# Patient Record
Sex: Male | Born: 1985 | Race: White | Hispanic: Yes | Marital: Single | Smoking: Current some day smoker
Health system: Southern US, Community
[De-identification: ages and names within clinical notes are randomized; demographics above are authoritative.]

## PROBLEM LIST (undated history)

## (undated) DIAGNOSIS — N200 Calculus of kidney: Secondary | ICD-10-CM

## (undated) HISTORY — PX: TONSILLECTOMY: SUR1361

---
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
## 2004-01-07 ENCOUNTER — Emergency Department (HOSPITAL_COMMUNITY): Admission: EM | Admit: 2004-01-07 | Discharge: 2004-01-07 | Payer: Self-pay | Admitting: Emergency Medicine

## 2004-01-22 ENCOUNTER — Emergency Department (HOSPITAL_COMMUNITY): Admission: EM | Admit: 2004-01-22 | Discharge: 2004-01-22 | Payer: Self-pay | Admitting: Emergency Medicine

## 2004-01-27 ENCOUNTER — Emergency Department (HOSPITAL_COMMUNITY): Admission: EM | Admit: 2004-01-27 | Discharge: 2004-01-27 | Payer: Self-pay | Admitting: *Deleted

## 2004-03-16 ENCOUNTER — Emergency Department (HOSPITAL_COMMUNITY): Admission: EM | Admit: 2004-03-16 | Discharge: 2004-03-16 | Payer: Self-pay | Admitting: Emergency Medicine

## 2004-03-21 ENCOUNTER — Emergency Department (HOSPITAL_COMMUNITY): Admission: EM | Admit: 2004-03-21 | Discharge: 2004-03-21 | Payer: Self-pay | Admitting: Emergency Medicine

## 2004-05-05 ENCOUNTER — Emergency Department (HOSPITAL_COMMUNITY): Admission: EM | Admit: 2004-05-05 | Discharge: 2004-05-06 | Payer: Self-pay | Admitting: Emergency Medicine

## 2004-05-06 ENCOUNTER — Ambulatory Visit (HOSPITAL_COMMUNITY): Admission: RE | Admit: 2004-05-06 | Discharge: 2004-05-06 | Payer: Self-pay | Admitting: Emergency Medicine

## 2004-07-31 ENCOUNTER — Emergency Department (HOSPITAL_COMMUNITY): Admission: EM | Admit: 2004-07-31 | Discharge: 2004-08-01 | Payer: Self-pay

## 2005-05-02 ENCOUNTER — Emergency Department (HOSPITAL_COMMUNITY): Admission: EM | Admit: 2005-05-02 | Discharge: 2005-05-02 | Payer: Self-pay | Admitting: *Deleted

## 2005-05-06 ENCOUNTER — Ambulatory Visit: Payer: Self-pay | Admitting: Orthopedic Surgery

## 2005-05-11 ENCOUNTER — Ambulatory Visit: Payer: Self-pay | Admitting: Orthopedic Surgery

## 2005-10-23 ENCOUNTER — Emergency Department (HOSPITAL_COMMUNITY): Admission: EM | Admit: 2005-10-23 | Discharge: 2005-10-23 | Payer: Self-pay | Admitting: Emergency Medicine

## 2012-09-01 ENCOUNTER — Encounter (HOSPITAL_COMMUNITY): Payer: Self-pay

## 2012-09-01 ENCOUNTER — Emergency Department (HOSPITAL_COMMUNITY)
Admission: EM | Admit: 2012-09-01 | Discharge: 2012-09-01 | Disposition: A | Discharge status: Home / Self Care | Payer: Self-pay | Attending: Emergency Medicine | Admitting: Emergency Medicine

## 2012-09-01 ENCOUNTER — Emergency Department (HOSPITAL_COMMUNITY): Payer: Self-pay

## 2012-09-01 DIAGNOSIS — N201 Calculus of ureter: Secondary | ICD-10-CM | POA: Insufficient documentation

## 2012-09-01 DIAGNOSIS — N23 Unspecified renal colic: Secondary | ICD-10-CM

## 2012-09-01 HISTORY — DX: Calculus of kidney: N20.0

## 2012-09-01 MED ORDER — ONDANSETRON HCL 4 MG/2ML IJ SOLN
4.0000 mg | Freq: Once | INTRAMUSCULAR | Status: AC
  Administered 2012-09-01: 4 mg via INTRAVENOUS

## 2012-09-01 MED ORDER — PROMETHAZINE HCL 25 MG PO TABS: 25.0000 mg | ORAL_TABLET | Freq: Four times a day (QID) | ORAL | Status: DC | PRN

## 2012-09-01 MED ORDER — HYDROMORPHONE HCL PF 1 MG/ML IJ SOLN
INTRAMUSCULAR | Status: AC
  Administered 2012-09-01: 1 mg via INTRAVENOUS
  Filled 2012-09-01: qty 1

## 2012-09-01 MED ORDER — HYDROMORPHONE HCL PF 1 MG/ML IJ SOLN
1.0000 mg | Freq: Once | INTRAMUSCULAR | Status: AC
  Administered 2012-09-01: 1 mg via INTRAVENOUS

## 2012-09-01 MED ORDER — HYDROMORPHONE HCL PF 1 MG/ML IJ SOLN
INTRAMUSCULAR | Status: AC
  Administered 2012-09-01: 1 mg
  Filled 2012-09-01: qty 1

## 2012-09-01 MED ORDER — SODIUM CHLORIDE 0.9 % IV SOLN
INTRAVENOUS | Status: DC
  Administered 2012-09-01: 21:00:00 via INTRAVENOUS

## 2012-09-01 MED ORDER — FENTANYL CITRATE 0.05 MG/ML IJ SOLN
100.0000 ug | INTRAMUSCULAR | Status: DC | PRN
  Administered 2012-09-01: 100 ug via INTRAVENOUS
  Filled 2012-09-01: qty 2

## 2012-09-01 MED ORDER — KETOROLAC TROMETHAMINE 30 MG/ML IJ SOLN
30.0000 mg | Freq: Once | INTRAMUSCULAR | Status: AC
  Administered 2012-09-01: 30 mg via INTRAVENOUS
  Filled 2012-09-01: qty 1

## 2012-09-01 MED ORDER — OXYCODONE-ACETAMINOPHEN 5-325 MG PO TABS: 1.0000 | ORAL_TABLET | Freq: Four times a day (QID) | ORAL | Status: DC | PRN

## 2012-09-01 MED ORDER — OXYCODONE-ACETAMINOPHEN 5-325 MG PO TABS: 2.0000 | ORAL_TABLET | ORAL | Status: DC | PRN

## 2012-09-01 MED ORDER — HYDROMORPHONE HCL PF 1 MG/ML IJ SOLN
INTRAMUSCULAR | Status: AC
  Filled 2012-09-01: qty 1

## 2012-09-01 MED ORDER — ONDANSETRON HCL 4 MG/2ML IJ SOLN
4.0000 mg | INTRAMUSCULAR | Status: DC | PRN
  Administered 2012-09-01: 4 mg via INTRAVENOUS
  Filled 2012-09-01: qty 2

## 2012-09-01 MED ORDER — ONDANSETRON 8 MG PO TBDP
ORAL_TABLET | ORAL | Status: AC
  Administered 2012-09-01: 8 mg
  Filled 2012-09-01: qty 1

## 2012-09-01 MED ORDER — ONDANSETRON HCL 4 MG/2ML IJ SOLN
INTRAMUSCULAR | Status: AC
  Administered 2012-09-01: 4 mg via INTRAVENOUS
  Filled 2012-09-01: qty 2

## 2012-09-01 NOTE — ED Provider Notes (Signed)
History     CSN: [id number]  Arrival date & time 09/01/12  2047   First MD Initiated Contact with Patient 09/01/12 2102      Chief Complaint  Patient presents with  . Flank Pain     HPI Pt was seen at 2115.  Per pt, c/o sudden onset and persistence of constant left sided flank "pain" that began approx 1 hour ago.  Pt describes the pain as "my kidney stone pain."  Has been associated with nausea.  Denies injury, no dysuria/hematuria, no diarrhea, no fevers, no testicular pain/swelling, no abd pain, no CP/SOB, no rash.     Past Medical History  Diagnosis Date  . Kidney stones     No past surgical history on file.   History  Substance Use Topics  . Smoking status: Never Smoker   . Smokeless tobacco: Not on file  . Alcohol Use: Yes      Review of Systems ROS: Statement: All systems negative except as marked or noted in the HPI; Constitutional: Negative for fever and chills. ; ; Eyes: Negative for eye pain, redness and discharge. ; ; ENMT: Negative for ear pain, hoarseness, nasal congestion, sinus pressure and sore throat. ; ; Cardiovascular: Negative for chest pain, palpitations, diaphoresis, dyspnea and peripheral edema. ; ; Respiratory: Negative for cough, wheezing and stridor. ; ; Gastrointestinal: +nausea. Negative for diarrhea, abdominal pain, blood in stool, hematemesis, jaundice and rectal bleeding. . ; ; Genitourinary: +left flank pain. Negative for dysuria and hematuria. ; ; Musculoskeletal: Negative for back pain and neck pain. Negative for swelling and trauma.; ; Skin: Negative for pruritus, rash, abrasions, blisters, bruising and skin lesion.; ; Neuro: Negative for headache, lightheadedness and neck stiffness. Negative for weakness, altered level of consciousness , altered mental status, extremity weakness, paresthesias, involuntary movement, seizure and syncope.       Allergies  Review of patient's allergies indicates no known allergies.  Home Medications  No  current outpatient prescriptions on file.  BP 124/107  Pulse 66  Temp 98.4 F (36.9 C) (Oral)  Resp 20  SpO2 100%  Physical Exam 2120: Physical examination:  Nursing notes reviewed; Vital signs and O2 SAT reviewed;  Constitutional: Well developed, Well nourished, Well hydrated, Uncomfortable appearing; Head:  Normocephalic, atraumatic; Eyes: EOMI, PERRL, No scleral icterus; ENMT: Mouth and pharynx normal, Mucous membranes moist; Neck: Supple, Full range of motion, No lymphadenopathy; Cardiovascular: Regular rate and rhythm, No gallop; Respiratory: Breath sounds clear & equal bilaterally, No wheezes.  Speaking full sentences with ease, Normal respiratory effort/excursion; Chest: Nontender, Movement normal; Abdomen: Soft, Nontender, Nondistended, Normal bowel sounds; Genitourinary: No CVA tenderness; Spine:  No midline CS, TS, LS tenderness. +TTP left lumbar paraspinal muscles.;; Extremities: Pulses normal, No tenderness, No edema, No calf edema or asymmetry.; Neuro: AA&Ox3, Major CN grossly intact.  Speech clear. No gross focal motor or sensory deficits in extremities.; Skin: Color normal, Warm, Dry.   ED Course  Procedures    MDM  MDM Reviewed: nursing note, vitals and previous chart Interpretation: CT scan     Ct Abdomen Pelvis Wo Contrast 09/01/2012  *RADIOLOGY REPORT*  Clinical Data: Left-sided flank pain for 2 hours, history of renal stones, prior lithotripsy  CT ABDOMEN AND PELVIS WITHOUT CONTRAST  Technique:  Multidetector CT imaging of the abdomen and pelvis was performed following the standard protocol without intravenous contrast.  Comparison: Abdominal CT - 10/23/2005  Findings: The lack of intravenous contrast limits the ability to evaluate solid abdominal organs.  There is  an approximately 5 x 5 mm stone within the superior aspect of the left ureter (image 46, series 2, coronal image 41) which results in moderate upstream ureterectasis and pelvocaliectasis. No right sided renal  stones are identified.  No right-sided urinary obstruction.  No stones seen within the urinary bladder.  Normal noncontrast appearance of the bilateral adrenal glands, pancreas and spleen.  Incidental note is made of a small splenule.  Normal hepatic contour.  Normal noncontrast appearance of the gallbladder.  No ascites.  Scattered minimal colonic diverticulosis without evidence of diverticulitis.  The bowel is otherwise normal in course and caliber without wall thickening or evidence of obstruction.  Normal noncontrast appearance of the appendix.  No pneumoperitoneum, pneumatosis or portal venous gas.  Normal caliber of the abdominal aorta.  No definite retroperitoneal, mesenteric, pelvic or inguinal lymphadenopathy on this noncontrast examination.  Normal noncontrast appearance of the pelvic organs.  No free fluid in the pelvis.  Limited visualization of the lower thorax is negative for focal airspace opacity or pleural effusion.  Normal heart size.  No pericardial effusion.  No acute or aggressive osseous abnormalities.  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  IMPRESSION: Approximately 5 x 5 mm stone in the superior aspect of the left ureter results in moderate to upstream ureterectasis and pyelocaliectasis.  No additional renal stones are identified.  No right-sided urinary obstruction.   Original Report Authenticated By: Waynard Reeds, M.D.      2230:  Pt continues uncomfortable.  More IV meds ordered.  Sign out to Dr. Adriana Simas.     Laray Anger, DO 09/03/12 [phone]

## 2012-09-01 NOTE — ED Provider Notes (Signed)
History     CSN: [id number]  Arrival date & time 09/01/12  2047   First MD Initiated Contact with Patient 09/01/12 2102      Chief Complaint  Patient presents with  . Flank Pain    (Consider location/radiation/quality/duration/timing/severity/associated sxs/prior treatment) HPI  Past Medical History  Diagnosis Date  . Kidney stones     No past surgical history on file.  No family history on file.  History  Substance Use Topics  . Smoking status: Never Smoker   . Smokeless tobacco: Not on file  . Alcohol Use: Yes      Review of Systems  Allergies  Review of patient's allergies indicates no known allergies.  Home Medications   Current Outpatient Rx  Name Route Sig Dispense Refill  . OXYCODONE-ACETAMINOPHEN 5-325 MG PO TABS Oral Take 1-2 tablets by mouth every 6 (six) hours as needed for pain. 25 tablet 0  . PROMETHAZINE HCL 25 MG PO TABS Oral Take 1 tablet (25 mg total) by mouth every 6 (six) hours as needed for nausea. 15 tablet 0    BP 124/107  Pulse 66  Temp 98.4 F (36.9 C) (Oral)  Resp 20  SpO2 100%  Physical Exam  ED Course  Procedures (including critical care time)   Labs Reviewed  URINALYSIS, ROUTINE W REFLEX MICROSCOPIC  URINE CULTURE   Ct Abdomen Pelvis Wo Contrast  09/01/2012  *RADIOLOGY REPORT*  Clinical Data: Left-sided flank pain for 2 hours, history of renal stones, prior lithotripsy  CT ABDOMEN AND PELVIS WITHOUT CONTRAST  Technique:  Multidetector CT imaging of the abdomen and pelvis was performed following the standard protocol without intravenous contrast.  Comparison: Abdominal CT - 10/23/2005  Findings: The lack of intravenous contrast limits the ability to evaluate solid abdominal organs.  There is an approximately 5 x 5 mm stone within the superior aspect of the left ureter (image 46, series 2, coronal image 41) which results in moderate upstream ureterectasis and pelvocaliectasis. No right sided renal stones are identified.  No  right-sided urinary obstruction.  No stones seen within the urinary bladder.  Normal noncontrast appearance of the bilateral adrenal glands, pancreas and spleen.  Incidental note is made of a small splenule.  Normal hepatic contour.  Normal noncontrast appearance of the gallbladder.  No ascites.  Scattered minimal colonic diverticulosis without evidence of diverticulitis.  The bowel is otherwise normal in course and caliber without wall thickening or evidence of obstruction.  Normal noncontrast appearance of the appendix.  No pneumoperitoneum, pneumatosis or portal venous gas.  Normal caliber of the abdominal aorta.  No definite retroperitoneal, mesenteric, pelvic or inguinal lymphadenopathy on this noncontrast examination.  Normal noncontrast appearance of the pelvic organs.  No free fluid in the pelvis.  Limited visualization of the lower thorax is negative for focal airspace opacity or pleural effusion.  Normal heart size.  No pericardial effusion.  No acute or aggressive osseous abnormalities.  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  IMPRESSION: Approximately 5 x 5 mm stone in the superior aspect of the left ureter results in moderate to upstream ureterectasis and pyelocaliectasis.  No additional renal stones are identified.  No right-sided urinary obstruction.   Original Report Authenticated By: Waynard Reeds, M.D.      1. Left ureteral calculus   2. Ureteral colic       MDM  CT scan shows 5 x 5 mm stone in left ureter. Discharge home with  medications for pain and nausea. Followup urology.        Donnetta Hutching, MD 09/01/12 2320

## 2012-09-01 NOTE — ED Notes (Signed)
Severe left flank pain onset 1 hour ago.

## 2012-09-05 MED FILL — Oxycodone w/ Acetaminophen Tab 5-325 MG: ORAL | Qty: 6 | Status: AC

## 2014-10-27 ENCOUNTER — Inpatient Hospital Stay (HOSPITAL_COMMUNITY): Payer: Medicaid Other | Admitting: Anesthesiology

## 2014-10-27 ENCOUNTER — Encounter (HOSPITAL_COMMUNITY)
Admission: EM | Disposition: A | Discharge status: Home / Self Care | Payer: Self-pay | Source: Home / Self Care | Attending: Internal Medicine

## 2014-10-27 ENCOUNTER — Encounter (HOSPITAL_COMMUNITY): Payer: Self-pay

## 2014-10-27 ENCOUNTER — Inpatient Hospital Stay (HOSPITAL_COMMUNITY)
Admission: EM | Admit: 2014-10-27 | Discharge: 2014-10-30 | DRG: 369 | Disposition: A | Discharge status: Home / Self Care | Payer: Medicaid Other | Attending: Internal Medicine | Admitting: Internal Medicine

## 2014-10-27 DIAGNOSIS — F17210 Nicotine dependence, cigarettes, uncomplicated: Secondary | ICD-10-CM | POA: Diagnosis present

## 2014-10-27 DIAGNOSIS — K2931 Chronic superficial gastritis with bleeding: Secondary | ICD-10-CM | POA: Diagnosis not present

## 2014-10-27 DIAGNOSIS — D62 Acute posthemorrhagic anemia: Secondary | ICD-10-CM | POA: Diagnosis present

## 2014-10-27 DIAGNOSIS — K2971 Gastritis, unspecified, with bleeding: Secondary | ICD-10-CM

## 2014-10-27 DIAGNOSIS — K922 Gastrointestinal hemorrhage, unspecified: Secondary | ICD-10-CM | POA: Diagnosis present

## 2014-10-27 DIAGNOSIS — K2901 Acute gastritis with bleeding: Secondary | ICD-10-CM | POA: Diagnosis not present

## 2014-10-27 DIAGNOSIS — K226 Gastro-esophageal laceration-hemorrhage syndrome: Principal | ICD-10-CM | POA: Diagnosis present

## 2014-10-27 DIAGNOSIS — Z87442 Personal history of urinary calculi: Secondary | ICD-10-CM | POA: Diagnosis not present

## 2014-10-27 HISTORY — PX: ESOPHAGOGASTRODUODENOSCOPY (EGD) WITH PROPOFOL: SHX5813

## 2014-10-27 LAB — COMPREHENSIVE METABOLIC PANEL
ALT: 13 U/L (ref 0–53)
AST: 17 U/L (ref 0–37)
Albumin: 4.2 g/dL (ref 3.5–5.2)
Alkaline Phosphatase: 83 U/L (ref 39–117)
Anion gap: 13 (ref 5–15)
BUN: 23 mg/dL (ref 6–23)
CO2: 25 mEq/L (ref 19–32)
Calcium: 9.1 mg/dL (ref 8.4–10.5)
Chloride: 104 mEq/L (ref 96–112)
Creatinine, Ser: 0.81 mg/dL (ref 0.50–1.35)
GFR calc Af Amer: >90 mL/min (ref >90)
GFR calc non Af Amer: >90 mL/min (ref >90)
Glucose, Bld: 102 mg/dL — ABNORMAL HIGH (ref 70–99)
Potassium: 4.1 mEq/L (ref 3.7–5.3)
Sodium: 142 mEq/L (ref 137–147)
Total Bilirubin: 0.6 mg/dL (ref 0.3–1.2)
Total Protein: 7.3 g/dL (ref 6.0–8.3)

## 2014-10-27 LAB — CBC
HCT: 36.9 % — ABNORMAL LOW (ref 39.0–52.0)
HCT: 30.8 % — ABNORMAL LOW (ref 39.0–52.0)
HCT: 29 % — ABNORMAL LOW (ref 39.0–52.0)
Hemoglobin: 12.8 g/dL — ABNORMAL LOW (ref 13.0–17.0)
Hemoglobin: 10.6 g/dL — ABNORMAL LOW (ref 13.0–17.0)
Hemoglobin: 10.1 g/dL — ABNORMAL LOW (ref 13.0–17.0)
MCH: 31.1 pg (ref 26.0–34.0)
MCH: 30.9 pg (ref 26.0–34.0)
MCH: 30.9 pg (ref 26.0–34.0)
MCHC: 34.7 g/dL (ref 30.0–36.0)
MCHC: 34.4 g/dL (ref 30.0–36.0)
MCHC: 34.8 g/dL (ref 30.0–36.0)
MCV: 89.6 fL (ref 78.0–100.0)
MCV: 89.8 fL (ref 78.0–100.0)
MCV: 88.7 fL (ref 78.0–100.0)
Platelets: 272 10*3/uL (ref 150–400)
Platelets: 249 10*3/uL (ref 150–400)
Platelets: 237 10*3/uL (ref 150–400)
RBC: 4.12 MIL/uL — ABNORMAL LOW (ref 4.22–5.81)
RBC: 3.43 MIL/uL — ABNORMAL LOW (ref 4.22–5.81)
RBC: 3.27 MIL/uL — ABNORMAL LOW (ref 4.22–5.81)
RDW: 12.8 % (ref 11.5–15.5)
RDW: 12.6 % (ref 11.5–15.5)
RDW: 12.7 % (ref 11.5–15.5)
WBC: 11.3 10*3/uL — ABNORMAL HIGH (ref 4.0–10.5)
WBC: 8.4 10*3/uL (ref 4.0–10.5)
WBC: 8.1 10*3/uL (ref 4.0–10.5)

## 2014-10-27 LAB — ABO/RH: ABO/RH(D): O POS

## 2014-10-27 LAB — CBC WITH DIFFERENTIAL/PLATELET
Basophils Absolute: 0 10*3/uL (ref 0.0–0.1)
Basophils Relative: 0 % (ref 0–1)
Eosinophils Absolute: 0.2 10*3/uL (ref 0.0–0.7)
Eosinophils Relative: 2 % (ref 0–5)
HCT: 41.5 % (ref 39.0–52.0)
Hemoglobin: 14.1 g/dL (ref 13.0–17.0)
Lymphocytes Relative: 18 % (ref 12–46)
Lymphs Abs: 1.7 10*3/uL (ref 0.7–4.0)
MCH: 30.9 pg (ref 26.0–34.0)
MCHC: 34 g/dL (ref 30.0–36.0)
MCV: 90.8 fL (ref 78.0–100.0)
Monocytes Absolute: 1.2 10*3/uL — ABNORMAL HIGH (ref 0.1–1.0)
Monocytes Relative: 12 % (ref 3–12)
Neutro Abs: 6.4 10*3/uL (ref 1.7–7.7)
Neutrophils Relative %: 68 % (ref 43–77)
Platelets: 265 10*3/uL (ref 150–400)
RBC: 4.57 MIL/uL (ref 4.22–5.81)
RDW: 12.6 % (ref 11.5–15.5)
WBC: 9.5 10*3/uL (ref 4.0–10.5)

## 2014-10-27 LAB — MRSA PCR SCREENING: MRSA by PCR: NEGATIVE

## 2014-10-27 LAB — PREPARE RBC (CROSSMATCH)

## 2014-10-27 LAB — I-STAT CG4 LACTIC ACID, ED: Lactic Acid, Venous: 0.87 mmol/L (ref 0.5–2.2)

## 2014-10-27 LAB — PROTIME-INR
INR: 1.02 (ref 0.00–1.49)
Prothrombin Time: 13.5 seconds (ref 11.6–15.2)

## 2014-10-27 SURGERY — ESOPHAGOGASTRODUODENOSCOPY (EGD) WITH PROPOFOL
Anesthesia: Monitor Anesthesia Care

## 2014-10-27 MED ORDER — SODIUM CHLORIDE 0.9 % IV SOLN
INTRAVENOUS | Status: DC
  Administered 2014-10-27: 15:00:00 via INTRAVENOUS

## 2014-10-27 MED ORDER — FENTANYL CITRATE 0.05 MG/ML IJ SOLN
INTRAMUSCULAR | Status: DC | PRN
  Administered 2014-10-27 (×2): 25 ug via INTRAVENOUS

## 2014-10-27 MED ORDER — ONDANSETRON HCL 4 MG PO TABS
4.0000 mg | ORAL_TABLET | Freq: Four times a day (QID) | ORAL | Status: AC
  Administered 2014-10-27 – 2014-10-28 (×4): 4 mg via ORAL
  Filled 2014-10-27 (×8): qty 1

## 2014-10-27 MED ORDER — MIDAZOLAM HCL 5 MG/5ML IJ SOLN
INTRAMUSCULAR | Status: DC | PRN
  Administered 2014-10-27 (×2): 2 mg via INTRAVENOUS

## 2014-10-27 MED ORDER — ONDANSETRON HCL 4 MG PO TABS: 4.0000 mg | ORAL_TABLET | Freq: Four times a day (QID) | ORAL | Status: DC | PRN

## 2014-10-27 MED ORDER — MORPHINE SULFATE 2 MG/ML IJ SOLN
1.0000 mg | INTRAMUSCULAR | Status: DC | PRN
  Administered 2014-10-28 – 2014-10-30 (×3): 1 mg via INTRAVENOUS
  Filled 2014-10-27 (×3): qty 1

## 2014-10-27 MED ORDER — MIDAZOLAM HCL 2 MG/2ML IJ SOLN
INTRAMUSCULAR | Status: AC
  Filled 2014-10-27: qty 2

## 2014-10-27 MED ORDER — GLYCOPYRROLATE 0.2 MG/ML IJ SOLN
INTRAMUSCULAR | Status: AC
  Filled 2014-10-27: qty 1

## 2014-10-27 MED ORDER — PROPOFOL 10 MG/ML IV EMUL
INTRAVENOUS | Status: AC
  Filled 2014-10-27: qty 20

## 2014-10-27 MED ORDER — METOCLOPRAMIDE HCL 5 MG/ML IJ SOLN
INTRAMUSCULAR | Status: AC
  Filled 2014-10-27: qty 2

## 2014-10-27 MED ORDER — ONDANSETRON HCL 4 MG/2ML IJ SOLN
INTRAMUSCULAR | Status: DC | PRN
  Administered 2014-10-27: 4 mg via INTRAVENOUS

## 2014-10-27 MED ORDER — SODIUM CHLORIDE 0.9 % IV SOLN
1000.0000 mL | Freq: Once | INTRAVENOUS | Status: AC
  Administered 2014-10-27: 1000 mL via INTRAVENOUS

## 2014-10-27 MED ORDER — METOCLOPRAMIDE HCL 5 MG/ML IJ SOLN
10.0000 mg | Freq: Four times a day (QID) | INTRAMUSCULAR | Status: AC
  Administered 2014-10-27 – 2014-10-28 (×4): 10 mg via INTRAVENOUS
  Filled 2014-10-27 (×4): qty 2

## 2014-10-27 MED ORDER — ONDANSETRON HCL 4 MG/2ML IJ SOLN: 4.0000 mg | Freq: Four times a day (QID) | INTRAMUSCULAR | Status: DC | PRN

## 2014-10-27 MED ORDER — SODIUM CHLORIDE 0.9 % IV SOLN: Freq: Once | INTRAVENOUS | Status: DC

## 2014-10-27 MED ORDER — FENTANYL CITRATE 0.05 MG/ML IJ SOLN
INTRAMUSCULAR | Status: AC
  Filled 2014-10-27: qty 2

## 2014-10-27 MED ORDER — SODIUM CHLORIDE 0.9 % IV SOLN
80.0000 mg | Freq: Two times a day (BID) | INTRAVENOUS | Status: DC
  Administered 2014-10-27 – 2014-10-28 (×4): 80 mg via INTRAVENOUS
  Filled 2014-10-27 (×5): qty 80

## 2014-10-27 MED ORDER — PANTOPRAZOLE SODIUM 40 MG IV SOLR
40.0000 mg | Freq: Once | INTRAVENOUS | Status: AC
  Administered 2014-10-27: 40 mg via INTRAVENOUS

## 2014-10-27 MED ORDER — ONDANSETRON HCL 4 MG/2ML IJ SOLN
4.0000 mg | Freq: Once | INTRAMUSCULAR | Status: AC
  Administered 2014-10-27: 4 mg via INTRAVENOUS

## 2014-10-27 MED ORDER — PROPOFOL 10 MG/ML IV BOLUS
INTRAVENOUS | Status: AC
  Filled 2014-10-27: qty 20

## 2014-10-27 MED ORDER — STERILE WATER FOR IRRIGATION IR SOLN
Status: DC | PRN
  Administered 2014-10-27: 13:00:00

## 2014-10-27 MED ORDER — LORAZEPAM 2 MG/ML IJ SOLN
1.0000 mg | Freq: Four times a day (QID) | INTRAMUSCULAR | Status: AC
  Administered 2014-10-27 – 2014-10-28 (×3): 1 mg via INTRAVENOUS
  Filled 2014-10-27 (×3): qty 1

## 2014-10-27 MED ORDER — SODIUM CHLORIDE 0.9 % IV SOLN
INTRAVENOUS | Status: DC
  Administered 2014-10-27: 10:00:00 via INTRAVENOUS
  Administered 2014-10-27 – 2014-10-28 (×2): 1000 mL via INTRAVENOUS
  Administered 2014-10-28 – 2014-10-30 (×3): via INTRAVENOUS

## 2014-10-27 MED ORDER — SODIUM CHLORIDE 0.9 % IV SOLN: INTRAVENOUS | Status: DC

## 2014-10-27 MED ORDER — INFLUENZA VAC SPLIT QUAD 0.5 ML IM SUSY
0.5000 mL | PREFILLED_SYRINGE | INTRAMUSCULAR | Status: AC
  Administered 2014-10-28: 0.5 mL via INTRAMUSCULAR
  Filled 2014-10-27: qty 0.5

## 2014-10-27 MED ORDER — SODIUM CHLORIDE 0.9 % IJ SOLN
3.0000 mL | Freq: Two times a day (BID) | INTRAMUSCULAR | Status: DC
  Administered 2014-10-27 – 2014-10-30 (×7): 3 mL via INTRAVENOUS

## 2014-10-27 MED ORDER — PROPOFOL INFUSION 10 MG/ML OPTIME
INTRAVENOUS | Status: DC | PRN
  Administered 2014-10-27: 125 ug/kg/min via INTRAVENOUS

## 2014-10-27 MED ORDER — METOCLOPRAMIDE HCL 5 MG/ML IJ SOLN
10.0000 mg | Freq: Once | INTRAMUSCULAR | Status: AC
  Administered 2014-10-27: 10 mg via INTRAVENOUS

## 2014-10-27 MED ORDER — SODIUM CHLORIDE 0.9 % IV SOLN: 1000.0000 mL | INTRAVENOUS | Status: DC

## 2014-10-27 MED ORDER — PROPOFOL 10 MG/ML IV BOLUS
INTRAVENOUS | Status: DC | PRN
  Administered 2014-10-27 (×2): 20 mg via INTRAVENOUS

## 2014-10-27 MED ORDER — LACTATED RINGERS IV SOLN
INTRAVENOUS | Status: DC | PRN
  Administered 2014-10-27: 13:00:00 via INTRAVENOUS

## 2014-10-27 SURGICAL SUPPLY — 21 items
BLOCK BITE 60FR ADLT L/F BLUE (MISCELLANEOUS) ×3 IMPLANT
ELECT REM PT RETURN 9FT ADLT (ELECTROSURGICAL)
ELECTRODE REM PT RTRN 9FT ADLT (ELECTROSURGICAL) IMPLANT
FLOOR PAD 36X40 (MISCELLANEOUS) ×3
FORCEP COLD BIOPSY (CUTTING FORCEPS) IMPLANT
FORCEPS BIOP RAD 4 LRG CAP 4 (CUTTING FORCEPS) IMPLANT
FORMALIN 10 PREFIL 20ML (MISCELLANEOUS) IMPLANT
HEMOCLIP INSTINCT (CLIP) ×9 IMPLANT
KIT CLEAN ENDO COMPLIANCE (KITS) ×3 IMPLANT
MANIFOLD NEPTUNE II (INSTRUMENTS) ×3 IMPLANT
NEEDLE SCLEROTHERAPY 25GX240 (NEEDLE) IMPLANT
PAD FLOOR 36X40 (MISCELLANEOUS) ×1 IMPLANT
PROBE APC STR FIRE (PROBE) IMPLANT
PROBE INJECTION GOLD (MISCELLANEOUS)
PROBE INJECTION GOLD 7FR (MISCELLANEOUS) IMPLANT
SNARE ROTATE MED OVAL 20MM (MISCELLANEOUS) IMPLANT
SNARE SHORT THROW 13M SML OVAL (MISCELLANEOUS) ×3 IMPLANT
SYR 50ML LL SCALE MARK (SYRINGE) ×3 IMPLANT
SYR INFLATION 60ML (SYRINGE) IMPLANT
TUBING IRRIGATION ENDOGATOR (MISCELLANEOUS) ×3 IMPLANT
WATER STERILE IRR 1000ML POUR (IV SOLUTION) ×3 IMPLANT

## 2014-10-27 NOTE — Anesthesia Postprocedure Evaluation (Signed)
  Anesthesia Post-op Note  Patient: Bryce Benson  Procedure(s) Performed: Procedure(s): ESOPHAGOGASTRODUODENOSCOPY (EGD) WITH PROPOFOL WITH THREE  CLIPS APPLIED (N/A)  Patient Location: PACU  Anesthesia Type:MAC  Level of Consciousness: awake, alert  and oriented  Airway and Oxygen Therapy: Patient Spontanous Breathing and Patient connected to face mask oxygen  Post-op Pain: none  Post-op Assessment: Post-op Vital signs reviewed, Patient's Cardiovascular Status Stable, Respiratory Function Stable, Patent Airway and No signs of Nausea or vomiting  Post-op Vital Signs: Reviewed and stable  Last Vitals:  Filed Vitals:   10/27/14 1355  BP:   Pulse:   Temp: 36.6 C  Resp:     Complications: No apparent anesthesia complications

## 2014-10-27 NOTE — Op Note (Signed)
EGD PROCEDURE REPORT  PATIENT:  Bryce Benson  MR#:  [id number] Birthdate:  08/07/1986, 28 y.o., male Endoscopist:  Dr. Malissa HippoNajeeb U. Bridie Colquhoun, MD Referred By:  Dr. Gust BroomsEstella Y. Ardyth Benson , MD  Procedure Date: 10/27/2014  Procedure:   EGD with therapeutic intervention.  Indications:  Patient is 28 year old Hispanic male who presents with upper GI bleed. He drinks beer regularly and takes Goody powder.            Informed Consent:  The risks, benefits, alternatives & imponderables which include, but are not limited to, bleeding, infection, perforation, drug reaction and potential missed lesion have been reviewed.  The potential for biopsy, lesion removal, esophageal dilation, etc. have also been discussed.  Questions have been answered.  All parties agreeable.  Please see history & physical in medical record for more information.  Medications:  Monitored anesthesia care. Cetacaine spray topically for oropharyngeal anesthesia  Description of procedure:  The endoscope was introduced through the mouth and advanced to the second portion of the duodenum without difficulty or limitations. The mucosal surfaces were surveyed very carefully during advancement of the scope and upon withdrawal.  Findings:  Esophagus:  Mucosa of the esophagus normal. Fresh blood noted to be regurgitating from the stomach. Stomach:  Stomach full of fresh blood and large clot. Bleeding/oozing noted to be from just below GE junction along the lesser curvature partly covered with clot. 3 hemoclips applied to the site. Duodenum:  Limited exam because of blood but no obvious abnormality.  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 Three instinct clips applied and hemostasis. Examination of upper GI tract was incomplete because of fresh blood in large clot in the stomach.   Recommendations:  Continue pantoprazole IV  infusion. NPO. Loperamide 10 mg IV every 64 doses. Ondansetron 4 mg IV every 64 doses. Lorazepam 1 mg IV every 64 doses and then when necessary. H&H every 6 hours. Repeat EGD on 10/29/2014.  Uva Runkel U  10/27/2014  1:56 PM  CC: Dr. Bonnetta BarryNo PCP Per Patient & Dr. No ref. provider found

## 2014-10-27 NOTE — ED Notes (Signed)
Pt reports abd tonight, and when he had a bowel movement had dark and bright red blood.  Pt to treatment room and vomited bright red blood with some clots present.

## 2014-10-27 NOTE — Plan of Care (Signed)
Problem: Consults Goal: GI Bleeding Patient Education See Patient Education Module for education specifics. Outcome: Completed/Met Date Met:  10/27/14 Goal: Skin Care Protocol Initiated - if Braden Score 18 or less If consults are not indicated, leave blank or document N/A Outcome: Progressing Goal: Nutrition Consult-if indicated Outcome: Progressing Goal: Diabetes Guidelines if Diabetic/Glucose > 140 If diabetic or lab glucose is > 140 mg/dl - Initiate Diabetes/Hyperglycemia Guidelines & Document Interventions  Outcome: Not Applicable Date Met:  09/21/12  Problem: Phase I Progression Outcomes Goal: Pain controlled with appropriate interventions Outcome: Completed/Met Date Met:  10/27/14 Goal: OOB as tolerated unless otherwise ordered Outcome: Completed/Met Date Met:  10/27/14 Goal: Initial discharge plan identified Outcome: Completed/Met Date Met:  10/27/14 Goal: Voiding-avoid urinary catheter unless indicated Outcome: Completed/Met Date Met:  10/27/14 Goal: Hemodynamically stable Outcome: Progressing

## 2014-10-27 NOTE — Transfer of Care (Signed)
Immediate Anesthesia Transfer of Care Note  Patient: Bryce Benson  Procedure(s) Performed: Procedure(s): ESOPHAGOGASTRODUODENOSCOPY (EGD) WITH PROPOFOL (N/A)  Patient Location: PACU  Anesthesia Type:MAC  Level of Consciousness: sedated  Airway & Oxygen Therapy: Patient Spontanous Breathing and Patient connected to face mask oxygen  Post-op Assessment: Report given to PACU RN  Post vital signs: Reviewed and stable  Complications: No apparent anesthesia complications

## 2014-10-27 NOTE — ED Notes (Signed)
Nurse to call back for report.  

## 2014-10-27 NOTE — Anesthesia Preprocedure Evaluation (Addendum)
Anesthesia Evaluation  Patient identified by MRN, date of birth, ID band Patient awake    Reviewed: Allergy & Precautions, H&P , NPO status , Patient's Chart, lab work & pertinent test results  History of Anesthesia Complications Negative for: history of anesthetic complications  Airway Mallampati: I  TM Distance: >3 FB     Dental  (+) Teeth Intact,    Pulmonary  breath sounds clear to auscultation        Cardiovascular Exercise Tolerance: Good Rhythm:Regular Rate:Tachycardia     Neuro/Psych    GI/Hepatic Neg liver ROS, GERD-  Poorly Controlled,  Endo/Other  negative endocrine ROS  Renal/GU negative Renal ROS     Musculoskeletal   Abdominal   Peds  Hematology   Anesthesia Other Findings Hx ETOH abuse  Reproductive/Obstetrics                             Anesthesia Physical Anesthesia Plan  ASA: II and emergent  Anesthesia Plan: MAC   Post-op Pain Management:    Induction:   Airway Management Planned: Simple Face Mask  Additional Equipment:   Intra-op Plan:   Post-operative Plan:   Informed Consent: I have reviewed the patients History and Physical, chart, labs and discussed the procedure including the risks, benefits and alternatives for the proposed anesthesia with the patient or authorized representative who has indicated his/her understanding and acceptance.     Plan Discussed with: Anesthesiologist  Anesthesia Plan Comments:        Anesthesia Quick Evaluation

## 2014-10-27 NOTE — ED Provider Notes (Signed)
CSN: [id number]     Arrival date & time 10/27/14  0450 History   First MD Initiated Contact with Patient 10/27/14 0510     Chief Complaint  Patient presents with  . Hematemesis     (Consider location/radiation/quality/duration/timing/severity/associated sxs/prior Treatment) HPI Comments: Patient reports waking from sleep around 3:30 with sensation to have bowel movement. He noticed black stool in the toilet bowl. On arrival to the ED he had episode of coffee-ground emesis streaked with bright red blood and clots. Endorses some epigastric abdominal pain. Denies any dizziness, lightheadedness, chest pain or shortness of breath. He drank 3 beers last night and has several beers daily. He denies any excessive NSAID use. He uses BC powders occasionally. He denies any other medical problems.  The history is provided by the patient.    Past Medical History  Diagnosis Date  . Kidney stones    Past Surgical History  Procedure Laterality Date  . Tonsillectomy     No family history on file. History  Substance Use Topics  . Smoking status: Never Smoker   . Smokeless tobacco: Not on file  . Alcohol Use: Yes    Review of Systems  Constitutional: Negative for fever, activity change, appetite change and fatigue.  HENT: Negative for congestion and rhinorrhea.   Respiratory: Negative for cough, chest tightness and shortness of breath.   Gastrointestinal: Positive for nausea, vomiting, abdominal pain, diarrhea and blood in stool.  Genitourinary: Negative for dysuria and hematuria.  Musculoskeletal: Negative for myalgias, back pain and arthralgias.  Skin: Negative for rash.  Neurological: Negative for dizziness, weakness and headaches.  A complete 10 system review of systems was obtained and all systems are negative except as noted in the HPI and PMH.      Allergies  Review of patient's allergies indicates no known allergies.  Home Medications   Prior to Admission medications    Medication Sig Start Date End Date Taking? Authorizing Provider  oxyCODONE-acetaminophen (PERCOCET/ROXICET) 5-325 MG per tablet Take 1-2 tablets by mouth every 6 (six) hours as needed for pain. 09/01/12   Donnetta HutchingBrian Cook, MD  oxyCODONE-acetaminophen (PERCOCET/ROXICET) 5-325 MG per tablet Take 2 tablets by mouth every 4 (four) hours as needed for pain. 09/01/12   Donnetta HutchingBrian Cook, MD  promethazine (PHENERGAN) 25 MG tablet Take 1 tablet (25 mg total) by mouth every 6 (six) hours as needed for nausea. 09/01/12   Donnetta HutchingBrian Cook, MD   BP 135/87 mmHg  Pulse 111  Temp(Src) 98.1 F (36.7 C) (Oral)  Resp 21  Ht 5\' 9"  (1.753 m)  Wt 160 lb (72.576 kg)  BMI 23.62 kg/m2  SpO2 100% Physical Exam  Constitutional: He is oriented to person, place, and time. He appears well-developed and well-nourished. No distress.  HENT:  Head: Normocephalic and atraumatic.  Mouth/Throat: Oropharynx is clear and moist. No oropharyngeal exudate.  Eyes: Conjunctivae and EOM are normal. Pupils are equal, round, and reactive to light.  Neck: Normal range of motion. Neck supple.  No meningismus.  Cardiovascular: Normal rate, normal heart sounds and intact distal pulses.   No murmur heard. tachycardic  Pulmonary/Chest: Effort normal and breath sounds normal. No respiratory distress.  Abdominal: Soft. There is no tenderness. There is no rebound and no guarding.  Genitourinary: Guaiac positive stool.  Chaperone present. No gross blood. No hemorrhoids or fissures  Musculoskeletal: Normal range of motion. He exhibits no edema or tenderness.  Neurological: He is alert and oriented to person, place, and time. No cranial nerve deficit. He exhibits  normal muscle tone. Coordination normal.  No ataxia on finger to nose bilaterally. No pronator drift. 5/5 strength throughout. CN 2-12 intact. Negative Romberg. Equal grip strength. Sensation intact. Gait is normal.   Skin: Skin is warm.  Psychiatric: He has a normal mood and affect. His behavior  is normal.  Nursing note and vitals reviewed.   ED Course  Procedures (including critical care time) Labs Review Labs Reviewed  COMPREHENSIVE METABOLIC PANEL - Abnormal; Notable for the following:    Glucose, Bld 102 (*)    All other components within normal limits  CBC WITH DIFFERENTIAL - Abnormal; Notable for the following:    Monocytes Absolute 1.2 (*)    All other components within normal limits  PROTIME-INR  POC OCCULT BLOOD, ED  I-STAT CG4 LACTIC ACID, ED  TYPE AND SCREEN    Imaging Review No results found.   EKG Interpretation None      MDM   Final diagnoses:  Upper GI bleed   Bloody emesis and bloody bowel movements concerning for upper GI bleed. Tachycardic with stable blood pressure.  IV fluids, IV PPI, labs  Hemoglobin 14. Orthostatics negative. Heart rate has improved to 80s after 2 L of fluid.  Concern for peptic ulcer disease versus gastritis versus biliary Weiss tear. Given extent of GI bleeding, we will admit for observation and endoscopy. Discussed with Dr. Ardyth HarpsHernandez. D/w Dr. Karilyn Cotaehman who will plan for EGD today.  Vitals remained stable in the ED.  CRITICAL CARE Performed by: Glynn OctaveANCOUR, Akane Tessier Total critical care time: 30 Critical care time was exclusive of separately billable procedures and treating other patients. Critical care was necessary to treat or prevent imminent or life-threatening deterioration. Critical care was time spent personally by me on the following activities: development of treatment plan with patient and/or surrogate as well as nursing, discussions with consultants, evaluation of patient's response to treatment, examination of patient, obtaining history from patient or surrogate, ordering and performing treatments and interventions, ordering and review of laboratory studies, ordering and review of radiographic studies, pulse oximetry and re-evaluation of patient's condition.Glynn Octave\    Everlene Cunning, MD 10/27/14 [phone]

## 2014-10-27 NOTE — Consult Note (Signed)
Referring Provider: Henderson CloudEstela Y Hernandez Acosta, MD Primary Care Physician:  No PCP Per Patient Primary Gastroenterologist:  Dr. Karilyn Benson  Reason for Consultation:    Upper GI bleeding.  HPI:   Patient is a 28 year old Hispanic male who was admitted to ICU with the acute onset of hematemesis and melena. Patient reports that he's been having frequent heartburn over the last 1 months. He noted his stool to be dark brown yesterday. He woke up around 4 AM with abdominal cramps and had a bowel movement stool was loose and tarry. He felt warm and became diaphoretic. He stepped out of the house to get fresh air and then began vomiting. He vomited dark blood. He came to emergency room this morning. He was evaluated and begun on IV fluids and given IV PPI. Patient was admitted to ICU. Since he's been emergency room he's had 2 tarry stools. He did have coffee-ground emesis and some.blood within the last 1 hour. He states all of his symptoms of resolved and he is hungry. He takes Goody powder for headache and back pain on frequent basis. He usually takes no more than 1 a day. He drinks anywhere from 2-8 cans of beer a day. On week nights he drinks no more than 2 and on weekends he may drink 6-8. He has good appetite. He denies recent weight loss. He also denies dysphagia. He smokes sporadically. He missed smoke a cigarette or 2 in a month. He is married and has 4 children. He works at [employer] in [place]. He did not graduate from high school but got his GED few years later. His father is in good health. Mother had alcoholic liver disease and died at 9849 of liver failure 6 months ago.  Past Medical History  Diagnosis Date  . Kidney stones. He was treated by Dr. Rito Benson on. He had stenting and lithotripsy for right urolithiasis 11 years ago.          Past Surgical History  Procedure Laterality Date  . Tonsillectomy         He had ganglion cyst removed from his right wrist.  Prior to Admission  medications   Medication Sig Start Date End Date Taking? Authorizing Provider  oxyCODONE-acetaminophen (PERCOCET/ROXICET) 5-325 MG per tablet Take 1-2 tablets by mouth every 6 (six) hours as needed for pain. 09/01/12   Donnetta HutchingBrian Cook, MD  oxyCODONE-acetaminophen (PERCOCET/ROXICET) 5-325 MG per tablet Take 2 tablets by mouth every 4 (four) hours as needed for pain. 09/01/12   Donnetta HutchingBrian Cook, MD  promethazine (PHENERGAN) 25 MG tablet Take 1 tablet (25 mg total) by mouth every 6 (six) hours as needed for nausea. 09/01/12   Donnetta HutchingBrian Cook, MD    Current Facility-Administered Medications  Medication Dose Route Frequency Provider Last Rate Last Dose  . 0.9 %  sodium chloride infusion   Intravenous Once Bryce CloudEstela Y Hernandez Acosta, MD      . 0.9 %  sodium chloride infusion   Intravenous Continuous Bryce CloudEstela Y Hernandez Acosta, MD 100 mL/hr at 10/27/14 [phone]    . [START ON 10/28/2014] Influenza vac split quadrivalent PF (FLUARIX) injection 0.5 mL  0.5 mL Intramuscular Tomorrow-1000 Bryce Isaiah BlakesY Hernandez Acosta, MD      . metoCLOPramide (REGLAN) injection 10 mg  10 mg Intravenous Once Bryce HippoNajeeb U Rehman, MD      . morphine 2 MG/ML injection 1 mg  1 mg Intravenous Q4H PRN Bryce CloudEstela Y Hernandez Acosta, MD      . ondansetron [facility](ZOFRAN) tablet 4 mg  4 mg Oral Q6H PRN Bryce Cloud, MD       Or  . ondansetron [facility]) injection 4 mg  4 mg Intravenous Q6H PRN Bryce Cloud, MD      . pantoprazole (PROTONIX) 80 mg in sodium chloride 0.9 % 100 mL IVPB  80 mg Intravenous Q12H Bryce Cloud, MD   80 mg at 10/27/14 1022  . sodium chloride 0.9 % injection 3 mL  3 mL Intravenous Q12H Bryce Cloud, MD   3 mL at 10/27/14 1001    Allergies as of 10/27/2014  . (No Known Allergies)    History reviewed. No pertinent family history.  History   Social History  . Marital Status: Single    Spouse Name: N/A    Number of Children: N/A  . Years of Education: N/A   Occupational History  . Not on  file.   Social History Main Topics  . Smoking status: Never Smoker   . Smokeless tobacco: Not on file  . Alcohol Use: Yes  . Drug Use: No  . Sexual Activity: Not on file   Other Topics Concern  . Not on file   Social History Narrative    Review of Systems: See HPI, otherwise normal ROS  Physical Exam: Temp:  [98.1 F (36.7 C)] 98.1 F (36.7 C) (12/05 0504) Pulse Rate:  [65-111] 65 (12/05 0700) Resp:  [21] 21 (12/05 0504) BP: (119-135)/(78-87) 119/78 mmHg (12/05 0818) SpO2:  [100 %] 100 % (12/05 0504) Weight:  [160 lb (72.576 kg)-173 lb 4.5 oz (78.6 kg)] 173 lb 4.5 oz (78.6 kg) (12/05 0818) Last BM Date: 10/27/14 Patient is alert and in no acute distress. Conjunctiva was pink. Sclerae nonicteric. Oropharyngeal mucosa is normal. No neck masses or thyromegaly noted. Cardiac exam with regular rhythm normal S1 and S2. No murmur or gallop noted. Lungs are clear to auscultation. Abdomen is symmetrical. Bowel sounds are normal. On palpation is soft and nontender without organomegaly or masses. No clubbing or edema noted. He has a large tattoo over his upper back and chest. Lab Results:  Recent Labs  10/27/14 0530 10/27/14 0855  WBC 9.5 11.3*  HGB 14.1 12.8*  HCT 41.5 36.9*  PLT 265 272   BMET  Recent Labs  10/27/14 0530  NA 142  K 4.1  CL 104  CO2 25  GLUCOSE 102*  BUN 23  CREATININE 0.81  CALCIUM 9.1   LFT  Recent Labs  10/27/14 0530  PROT 7.3  ALBUMIN 4.2  AST 17  ALT 13  ALKPHOS 83  BILITOT 0.6   PT/INR  Recent Labs  10/27/14 0530  LABPROT 13.5  INR 1.02    Assessment; Patient is 27 year old healthy-appearing Hispanic male who presents with acute onset of melena and hematemesis. He takes Goody powder on more or less daily basis for muscle skeletal pain and also drinks beer. He has been experiencing daily heartburn over the last 1 month. He does not have stigmata of chronic liver disease. His platelet count and INR are normal and so  transaminases. Suspect we are dealing with upper GI bleed secondary to peptic ulcer disease reflux esophagitis or Mallory-Weiss tear. Patient's hemoglobin has dropped by more than 1 g but he is hemodynamically stable.  Recommendations; Metoclopramide 10 mg IV 1 Agree with pantoprazole IV infusion. Esophagogastroduodenoscopy with propofol later today. Procedure risks reviewed with the patient and his wife and they're both agreeable.   LOS: 0 days   Benson,NAJEEB U  10/27/2014, 10:34 AM

## 2014-10-27 NOTE — H&P (Signed)
[facility]          History and Physical    PCP:   No PCP Per Patient   Chief Complaint:  Nausea, vomiting, black stools, black emesis  HPI: Patient is a 28 year old man without past medical history other than tonsillectomy as a child who presented to the hospital today with the above-mentioned complaints. He states that about 3:30 this morning he woke up with the feeling that he needed to have a bowel movement. When he went to the bathroom he noticed that his stool was very dark. He subsequently became very nauseous and when he went to vomit had dark-colored emesis. He had 3 episodes at home and then came to the hospital were he continued to have dark-colored emesis as well as some red blood-tinged emesis in the emergency department. He was noted to be tachycardic to the 110s, hemodynamically stable, hemoglobin of 14. Was given 40 mg of IV Protonix and we have been asked to admit him for further evaluation and management. Of note, he admits to taking Goody powders every day as well as drinking 2-3 beers every night.  Allergies:  No Known Allergies    Past Medical History  Diagnosis Date  . Kidney stones     Past Surgical History  Procedure Laterality Date  . Tonsillectomy      Prior to Admission medications   Medication Sig Start Date End Date Taking? Authorizing Provider  oxyCODONE-acetaminophen (PERCOCET/ROXICET) 5-325 MG per tablet Take 1-2 tablets by mouth every 6 (six) hours as needed for pain. 09/01/12   Nat Christen, MD  oxyCODONE-acetaminophen (PERCOCET/ROXICET) 5-325 MG per tablet Take 2 tablets by mouth every 4 (four) hours as needed for pain. 09/01/12   Nat Christen, MD  promethazine (PHENERGAN) 25 MG tablet Take 1 tablet (25 mg total) by mouth every 6 (six) hours as needed for nausea. 09/01/12   Nat Christen, MD    Social History:  reports that he has never smoked. He does not have any smokeless tobacco history on file. He reports that he drinks  alcohol. He reports that he does not use illicit drugs.  History reviewed. No pertinent family history.  Review of Systems:  Constitutional: Denies fever, chills, diaphoresis, appetite change and fatigue.  HEENT: Denies photophobia, eye pain, redness, hearing loss, ear pain, congestion, sore throat, rhinorrhea, sneezing, mouth sores, trouble swallowing, neck pain, neck stiffness and tinnitus.   Respiratory: Denies SOB, DOE, cough, chest tightness,  and wheezing.   Cardiovascular: Denies chest pain, palpitations and leg swelling.  Gastrointestinal: Denies nausea, vomiting, , blood in stool and abdominal distention.  Genitourinary: Denies dysuria, urgency, frequency, hematuria, flank pain and difficulty urinating.  Endocrine: Denies: hot or cold intolerance, sweats, changes in hair or nails, polyuria, polydipsia. Musculoskeletal: Denies myalgias, back pain, joint swelling, arthralgias and gait problem.  Skin: Denies pallor, rash and wound.  Neurological: Denies dizziness, seizures, syncope, weakness, light-headedness, numbness and headaches.  Hematological: Denies adenopathy. Easy bruising, personal or family bleeding history  Psychiatric/Behavioral: Denies suicidal ideation, mood changes, confusion, nervousness, sleep disturbance and agitation   Physical Exam: Blood pressure 119/78, pulse 65, temperature 98.1 F (36.7 C), temperature source Oral, resp. rate 21, height _0  (1.753 m), weight 78.6 kg (173 lb 4.5 oz), SpO2 100 %. General: Alert, awake, oriented 3, no acute distress, pleasant and cooperative with exam. HEENT: Normocephalic, atraumatic, pupils equal round and reactive to light, extraocular movements intact, moist  mucous membranes, mild pharyngeal erythema. Neck: Supple, no JVD, no lymphadenopathy, no bruits, no goiter. Cardiovascular: Regular rate and rhythm, no murmurs, rubs or gallops. Lungs: Clear to auscultation bilaterally. Abdomen: Soft, nontender, nondistended, positive  bowel sounds, no masses or organomegaly noted. Next and extremities: No clubbing, cyanosis or edema, positive pedal pulses. Neurologic: Grossly intact and nonfocal.  Labs on Admission:  Results for orders placed or performed during the hospital encounter of 10/27/14 (from the past 48 hour(s))  Protime-INR     Status: None   Collection Time: 10/27/14  5:30 AM  Result Value Ref Range   Prothrombin Time 13.5 11.6 - 15.2 seconds   INR 1.02 0.00 - 1.49  Type and screen     Status: None (Preliminary result)   Collection Time: 10/27/14  5:30 AM  Result Value Ref Range   ABO/RH(D) O POS    Antibody Screen NEG    Sample Expiration 10/30/2014    Unit Number [id number]    Blood Component Type RED CELLS,LR    Unit division 00    Status of Unit ALLOCATED    Transfusion Status OK TO TRANSFUSE    Crossmatch Result Compatible    Unit Number [id number]    Blood Component Type RED CELLS,LR    Unit division 00    Status of Unit ALLOCATED    Transfusion Status OK TO TRANSFUSE    Crossmatch Result Compatible   Comprehensive metabolic panel     Status: Abnormal   Collection Time: 10/27/14  5:30 AM  Result Value Ref Range   Sodium 142 137 - 147 mEq/L   Potassium 4.1 3.7 - 5.3 mEq/L   Chloride 104 96 - 112 mEq/L   CO2 25 19 - 32 mEq/L   Glucose, Bld 102 (H) 70 - 99 mg/dL   BUN 23 6 - 23 mg/dL   Creatinine, Ser 0.81 0.50 - 1.35 mg/dL   Calcium 9.1 8.4 - 10.5 mg/dL   Total Protein 7.3 6.0 - 8.3 g/dL   Albumin 4.2 3.5 - 5.2 g/dL   AST 17 0 - 37 U/L   ALT 13 0 - 53 U/L   Alkaline Phosphatase 83 39 - 117 U/L   Total Bilirubin 0.6 0.3 - 1.2 mg/dL   GFR calc non Af Amer >90 >90 mL/min   GFR calc Af Amer >90 >90 mL/min    Comment: (NOTE) The eGFR has been calculated using the CKD EPI equation. This calculation has not been validated in all clinical situations. eGFR's persistently <90 mL/min signify possible Chronic Kidney Disease.    Anion gap 13 5 - 15  CBC WITH DIFFERENTIAL     Status:  Abnormal   Collection Time: 10/27/14  5:30 AM  Result Value Ref Range   WBC 9.5 4.0 - 10.5 K/uL   RBC 4.57 4.22 - 5.81 MIL/uL   Hemoglobin 14.1 13.0 - 17.0 g/dL   HCT 41.5 39.0 - 52.0 %   MCV 90.8 78.0 - 100.0 fL   MCH 30.9 26.0 - 34.0 pg   MCHC 34.0 30.0 - 36.0 g/dL   RDW 12.6 11.5 - 15.5 %   Platelets 265 150 - 400 K/uL   Neutrophils Relative % 68 43 - 77 %   Neutro Abs 6.4 1.7 - 7.7 K/uL   Lymphocytes Relative 18 12 - 46 %   Lymphs Abs 1.7 0.7 - 4.0 K/uL   Monocytes Relative 12 3 - 12 %   Monocytes Absolute 1.2 (H) 0.1 - 1.0 K/uL  Eosinophils Relative 2 0 - 5 %   Eosinophils Absolute 0.2 0.0 - 0.7 K/uL   Basophils Relative 0 0 - 1 %   Basophils Absolute 0.0 0.0 - 0.1 K/uL  ABO/Rh     Status: None (Preliminary result)   Collection Time: 10/27/14  5:30 AM  Result Value Ref Range   ABO/RH(D) O POS   I-Stat CG4 Lactic Acid, ED     Status: None   Collection Time: 10/27/14  5:52 AM  Result Value Ref Range   Lactic Acid, Venous 0.87 0.5 - 2.2 mmol/L  CBC     Status: Abnormal   Collection Time: 10/27/14  8:55 AM  Result Value Ref Range   WBC 11.3 (H) 4.0 - 10.5 K/uL   RBC 4.12 (L) 4.22 - 5.81 MIL/uL   Hemoglobin 12.8 (L) 13.0 - 17.0 g/dL   HCT 36.9 (L) 39.0 - 52.0 %   MCV 89.6 78.0 - 100.0 fL   MCH 31.1 26.0 - 34.0 pg   MCHC 34.7 30.0 - 36.0 g/dL   RDW 12.8 11.5 - 15.5 %   Platelets 272 150 - 400 K/uL  Prepare RBC     Status: None   Collection Time: 10/27/14  9:00 AM  Result Value Ref Range   Order Confirmation ORDER PROCESSED BY BLOOD BANK     Radiological Exams on Admission: No results found.  Assessment/Plan Principal Problem:   GI bleed    GI bleed -Presumed upper GI bleed secondary to most likely peptic ulcer disease given NSAID/EtOH use. -Protonix IV 80 mg twice daily. -We'll check CBC every 6 hours, type and screen and hold 2 units of PRBCs. No need for transfusion at present although suspect hemoglobin will continue to drift down. -EDP has discussed  with GI, Dr. Laural Golden, with apparent plans for endoscopy later today.  DVT prophylaxis -SCDs given active GI bleed.  CODE STATUS -Full code   Time Spent on Admission: 85 minutes  HERNANDEZ ACOSTA,ESTELA [facility] Pager: [phone] 10/27/2014, 10:06 AM

## 2014-10-28 DIAGNOSIS — D62 Acute posthemorrhagic anemia: Secondary | ICD-10-CM

## 2014-10-28 DIAGNOSIS — K2901 Acute gastritis with bleeding: Secondary | ICD-10-CM

## 2014-10-28 LAB — CBC
HCT: 26.9 % — ABNORMAL LOW (ref 39.0–52.0)
Hemoglobin: 9.4 g/dL — ABNORMAL LOW (ref 13.0–17.0)
MCH: 31.1 pg (ref 26.0–34.0)
MCHC: 34.9 g/dL (ref 30.0–36.0)
MCV: 89.1 fL (ref 78.0–100.0)
Platelets: 222 10*3/uL (ref 150–400)
RBC: 3.02 MIL/uL — ABNORMAL LOW (ref 4.22–5.81)
RDW: 12.8 % (ref 11.5–15.5)
WBC: 6.1 10*3/uL (ref 4.0–10.5)

## 2014-10-28 LAB — BASIC METABOLIC PANEL
Anion gap: 10 (ref 5–15)
BUN: 18 mg/dL (ref 6–23)
CO2: 23 mEq/L (ref 19–32)
Calcium: 8.1 mg/dL — ABNORMAL LOW (ref 8.4–10.5)
Chloride: 106 mEq/L (ref 96–112)
Creatinine, Ser: 0.89 mg/dL (ref 0.50–1.35)
GFR calc Af Amer: >90 mL/min (ref >90)
GFR calc non Af Amer: >90 mL/min (ref >90)
Glucose, Bld: 103 mg/dL — ABNORMAL HIGH (ref 70–99)
Potassium: 3.8 mEq/L (ref 3.7–5.3)
Sodium: 139 mEq/L (ref 137–147)

## 2014-10-28 LAB — HEMOGLOBIN AND HEMATOCRIT, BLOOD
HCT: 24.8 % — ABNORMAL LOW (ref 39.0–52.0)
HCT: 23 % — ABNORMAL LOW (ref 39.0–52.0)
Hemoglobin: 8.7 g/dL — ABNORMAL LOW (ref 13.0–17.0)
Hemoglobin: 7.8 g/dL — ABNORMAL LOW (ref 13.0–17.0)

## 2014-10-28 MED ORDER — SODIUM CHLORIDE 0.9 % IV SOLN
Freq: Once | INTRAVENOUS | Status: AC
  Administered 2014-10-28: 250 mL via INTRAVENOUS

## 2014-10-28 MED ORDER — OXYCODONE HCL 5 MG PO TABS
10.0000 mg | ORAL_TABLET | Freq: Once | ORAL | Status: AC
Start: 2014-10-28 — End: 2014-10-28
  Administered 2014-10-28: 10 mg via ORAL
  Filled 2014-10-28: qty 2

## 2014-10-28 MED ORDER — DIPHENHYDRAMINE HCL 50 MG/ML IJ SOLN
25.0000 mg | Freq: Once | INTRAMUSCULAR | Status: AC
  Administered 2014-10-28: 25 mg via INTRAVENOUS
  Filled 2014-10-28: qty 1

## 2014-10-28 NOTE — Progress Notes (Signed)
  Subjective:  Patient has no complaints. He denies nausea epigastric pain or heartburn. He had a bowel movement this morning and passed small-volume tarry stool. He is hungry.   Objective: Blood pressure 109/62, pulse 89, temperature 98.5 F (36.9 C), temperature source Oral, resp. rate 16, height 5\' 9"  (1.753 m), weight 178 lb 9.2 oz (81 kg), SpO2 100 %. Patient is alert and in no acute distress. Abdomen is soft and nontender without organomegaly or masses. No LE edema or clubbing noted.  Labs/studies Results:   Recent Labs  10/27/14 1521 10/27/14 2047 10/28/14 0210  WBC 8.4 8.1 6.1  HGB 10.6* 10.1* 9.4*  HCT 30.8* 29.0* 26.9*  PLT 249 237 222    BMET   Recent Labs  10/27/14 0530 10/28/14 0535  NA 142 139  K 4.1 3.8  CL 104 106  CO2 25 23  GLUCOSE 102* 103*  BUN 23 18  CREATININE 0.81 0.89  CALCIUM 9.1 8.1*    LFT   Recent Labs  10/27/14 0530  PROT 7.3  ALBUMIN 4.2  AST 17  ALT 13  ALKPHOS 83  BILITOT 0.6     Assessment:  #1. Upper GI bleed secondary to Mallory-Weiss tear. Status post therapeutic EGD yesterday with application of 3 clips to bleeding site. Examination was incomplete because stomach was full of blood and clots. He also had blood in his duodenum. #2. Anemia secondary to upper GI bleed. Significant drop in H&H but not to the point that he needs transfusion.  Recommendations; Full liquid diet. Continue IV infusion of pantoprazole. H&H at 2 PM and 10 PM. CBC in a.m. Repeat EGD under propofol in a.m.

## 2014-10-28 NOTE — Plan of Care (Signed)
Problem: Phase II Progression Outcomes Goal: No active bleeding Outcome: Progressing Goal: Hemodynamically stable Outcome: Progressing Goal: H&H stablized < 1gm drop in 24 hrs Outcome: Progressing Goal: Progress activity as tolerated unless otherwise ordered Outcome: Progressing Goal: Tolerating diet Outcome: Progressing     

## 2014-10-28 NOTE — Progress Notes (Signed)
Patient has remained calm and cooperative of all care,  Notified Dr. Onalee Huaavid of Hgb levels.  Patient has been free of any vomitus or stools so far this shift.  Vitals remain stable at 109/58 map of 71, HR 83.  Have noted periods of brief apnea during sleep.

## 2014-10-28 NOTE — Anesthesia Postprocedure Evaluation (Signed)
  Anesthesia Post-op Note  Patient: Bryce Benson  Procedure(s) Performed: Procedure(s): ESOPHAGOGASTRODUODENOSCOPY (EGD) WITH PROPOFOL WITH THREE  CLIPS APPLIED (N/A)  Patient Location: ICU and Nursing Unit  Anesthesia Type:MAC  Level of Consciousness: awake  Airway and Oxygen Therapy: Patient Spontanous Breathing  Post-op Pain: none  Post-op Assessment: Post-op Vital signs reviewed, Patient's Cardiovascular Status Stable, Respiratory Function Stable, Patent Airway and No signs of Nausea or vomiting, remains npo, for another egd in am.  Post-op Vital Signs: Reviewed and stable  Last Vitals:  Filed Vitals:   10/28/14 1209  BP:   Pulse:   Temp: 36.7 C  Resp:     Complications: No apparent anesthesia complications

## 2014-10-28 NOTE — Addendum Note (Signed)
Addendum  created 10/28/14 1421 by Moshe SalisburyKaren E Filippa Yarbough, CRNA   Modules edited: Notes Section   Notes Section:  File: [id number]

## 2014-10-28 NOTE — Progress Notes (Signed)
[facility] PROGRESS NOTE  Bryce Benson ZOX:[id number] DOB: 04/30/1986 DOA: 10/27/2014 PCP: No PCP Per Patient  Assessment/Plan: Upper GI Bleed -2/2 MW Tear. 3 clips applied to bleeding site. -For repeat EGD in am as first EGD was incomplete given amount of blood present. -Continue BID PPI  Acute Blood Loss Anemia -2/2 GI Bleed -Hb has dropped 5 gr from 14 to 9. -No need for transfusion at present.  Code Status: Full Code Family Communication: Patient only  Disposition Plan: Home when ready; likely in 24-48 hours.   Consultants:  GI, Dr. Karilyn Cotaehman   Antibiotics:  None   Subjective: Small dark tarry stool today.  Objective: Filed Vitals:   10/28/14 0754 10/28/14 0800 10/28/14 0900 10/28/14 1209  BP:  115/61 105/50   Pulse: 89 81 95   Temp: 98.5 F (36.9 C)   98.1 F (36.7 C)  TempSrc: Oral   Oral  Resp: 16 14 11    Height:      Weight:      SpO2: 100% 100% 100%     Intake/Output Summary (Last 24 hours) at 10/28/14 1304 Last data filed at 10/28/14 1209  Gross per 24 hour  Intake 2708.33 ml  Output    504 ml  Net 2204.33 ml   Filed Weights   10/27/14 0504 10/27/14 0818 10/28/14 0500  Weight: 72.576 kg (160 lb) 78.6 kg (173 lb 4.5 oz) 81 kg (178 lb 9.2 oz)    Exam:   General:  AA Ox3  Cardiovascular: RRR, no M/R/G  Respiratory: CTA B  Abdomen: S/NT/ND/+BS  Extremities: no C/C/E/+pulses   Neurologic:  Intact/non-focal  Data Reviewed: Basic Metabolic Panel:  Recent Labs Lab 10/27/14 0530 10/28/14 0535  NA 142 139  K 4.1 3.8  CL 104 106  CO2 25 23  GLUCOSE 102* 103*  BUN 23 18  CREATININE 0.81 0.89  CALCIUM 9.1 8.1*   Liver Function Tests:  Recent Labs Lab 10/27/14 0530  AST 17  ALT 13  ALKPHOS 83  BILITOT 0.6  PROT 7.3  ALBUMIN 4.2   No results for input(s): LIPASE, AMYLASE in the last 168 hours. No results for input(s): AMMONIA in the last 168 hours. CBC:  Recent Labs Lab 10/27/14 0530 10/27/14 0855  10/27/14 1521 10/27/14 2047 10/28/14 0210  WBC 9.5 11.3* 8.4 8.1 6.1  NEUTROABS 6.4  --   --   --   --   HGB 14.1 12.8* 10.6* 10.1* 9.4*  HCT 41.5 36.9* 30.8* 29.0* 26.9*  MCV 90.8 89.6 89.8 88.7 89.1  PLT 265 272 249 237 222   Cardiac Enzymes: No results for input(s): CKTOTAL, CKMB, CKMBINDEX, TROPONINI in the last 168 hours. BNP (last 3 results) No results for input(s): PROBNP in the last 8760 hours. CBG: No results for input(s): GLUCAP in the last 168 hours.  Recent Results (from the past 240 hour(s))  MRSA PCR Screening     Status: None   Collection Time: 10/27/14  9:47 AM  Result Value Ref Range Status   MRSA by PCR NEGATIVE NEGATIVE Final    Comment:        The GeneXpert MRSA Assay (FDA approved for NASAL specimens only), is one component of a comprehensive MRSA colonization surveillance program. It is not intended to diagnose MRSA infection nor to guide or monitor treatment for MRSA infections.      Studies: No results found.  Scheduled Meds: . sodium chloride   Intravenous Once  . LORazepam  1 mg Intravenous  Q6H  . pantoprazole (PROTONIX) IV  80 mg Intravenous Q12H  . sodium chloride  3 mL Intravenous Q12H   Continuous Infusions: . sodium chloride 100 mL/hr at 10/28/14 0700    Principal Problem:   GI bleed    Time spent: 25 minutes. Greater than 50% of this time was spent in direct contact with the patient coordinating care.    Chaya JanHERNANDEZ ACOSTA,ESTELA  [facility] Pager [phone]  If 7PM-7AM, please contact night-coverage at [email or web address], password [email or web address] 10/28/2014, 1:04 PM  LOS: 1 day

## 2014-10-29 ENCOUNTER — Inpatient Hospital Stay (HOSPITAL_COMMUNITY): Payer: Medicaid Other | Admitting: Anesthesiology

## 2014-10-29 ENCOUNTER — Encounter (HOSPITAL_COMMUNITY)
Admission: EM | Disposition: A | Discharge status: Home / Self Care | Payer: Self-pay | Source: Home / Self Care | Attending: Internal Medicine

## 2014-10-29 ENCOUNTER — Encounter (HOSPITAL_COMMUNITY): Payer: Self-pay | Admitting: *Deleted

## 2014-10-29 DIAGNOSIS — D62 Acute posthemorrhagic anemia: Secondary | ICD-10-CM

## 2014-10-29 HISTORY — PX: ESOPHAGOGASTRODUODENOSCOPY (EGD) WITH PROPOFOL: SHX5813

## 2014-10-29 LAB — OCCULT BLOOD, POC DEVICE: Fecal Occult Bld: POSITIVE — AB

## 2014-10-29 LAB — CBC
HCT: 25.4 % — ABNORMAL LOW (ref 39.0–52.0)
Hemoglobin: 9 g/dL — ABNORMAL LOW (ref 13.0–17.0)
MCH: 31.1 pg (ref 26.0–34.0)
MCHC: 35.4 g/dL (ref 30.0–36.0)
MCV: 87.9 fL (ref 78.0–100.0)
Platelets: 174 10*3/uL (ref 150–400)
RBC: 2.89 MIL/uL — ABNORMAL LOW (ref 4.22–5.81)
RDW: 13 % (ref 11.5–15.5)
WBC: 7 10*3/uL (ref 4.0–10.5)

## 2014-10-29 LAB — HEMOGLOBIN AND HEMATOCRIT, BLOOD
HCT: 27.5 % — ABNORMAL LOW (ref 39.0–52.0)
Hemoglobin: 9.7 g/dL — ABNORMAL LOW (ref 13.0–17.0)

## 2014-10-29 SURGERY — ESOPHAGOGASTRODUODENOSCOPY (EGD) WITH PROPOFOL
Anesthesia: Monitor Anesthesia Care | Site: Esophagus

## 2014-10-29 MED ORDER — ONDANSETRON HCL 4 MG/2ML IJ SOLN
4.0000 mg | Freq: Once | INTRAMUSCULAR | Status: AC
  Administered 2014-10-29: 4 mg via INTRAVENOUS

## 2014-10-29 MED ORDER — LIDOCAINE HCL (CARDIAC) 10 MG/ML IV SOLN
INTRAVENOUS | Status: DC | PRN
  Administered 2014-10-29: 100 mg via INTRAVENOUS

## 2014-10-29 MED ORDER — LACTATED RINGERS IV SOLN
INTRAVENOUS | Status: DC
  Administered 2014-10-29: 1000 mL via INTRAVENOUS

## 2014-10-29 MED ORDER — MIDAZOLAM HCL 2 MG/2ML IJ SOLN
1.0000 mg | INTRAMUSCULAR | Status: DC | PRN
  Administered 2014-10-29: 2 mg via INTRAVENOUS

## 2014-10-29 MED ORDER — STERILE WATER FOR IRRIGATION IR SOLN
Status: DC | PRN
  Administered 2014-10-29: 5 mL

## 2014-10-29 MED ORDER — ONDANSETRON HCL 4 MG/2ML IJ SOLN
4.0000 mg | Freq: Four times a day (QID) | INTRAMUSCULAR | Status: DC | PRN
  Administered 2014-10-30: 4 mg via INTRAVENOUS
  Filled 2014-10-29: qty 2

## 2014-10-29 MED ORDER — FENTANYL CITRATE 0.05 MG/ML IJ SOLN
INTRAMUSCULAR | Status: AC
  Filled 2014-10-29: qty 2

## 2014-10-29 MED ORDER — ONDANSETRON HCL 4 MG/2ML IJ SOLN
INTRAMUSCULAR | Status: AC
  Administered 2014-10-29: 4 mg
  Filled 2014-10-29: qty 2

## 2014-10-29 MED ORDER — PROPOFOL 10 MG/ML IV BOLUS
INTRAVENOUS | Status: DC | PRN
  Administered 2014-10-29 (×3): 8.1 mg via INTRAVENOUS

## 2014-10-29 MED ORDER — MIDAZOLAM HCL 2 MG/2ML IJ SOLN
INTRAMUSCULAR | Status: AC
  Filled 2014-10-29: qty 2

## 2014-10-29 MED ORDER — BUTAMBEN-TETRACAINE-BENZOCAINE 2-2-14 % EX AERO
2.0000 | INHALATION_SPRAY | Freq: Once | CUTANEOUS | Status: AC
  Administered 2014-10-29: 2 via TOPICAL
  Filled 2014-10-29: qty 56

## 2014-10-29 MED ORDER — ONDANSETRON HCL 4 MG/2ML IJ SOLN
INTRAMUSCULAR | Status: AC
  Filled 2014-10-29: qty 2

## 2014-10-29 MED ORDER — FENTANYL CITRATE 0.05 MG/ML IJ SOLN
25.0000 ug | INTRAMUSCULAR | Status: DC | PRN
  Administered 2014-10-29 (×2): 50 ug via INTRAVENOUS

## 2014-10-29 MED ORDER — PROPOFOL 10 MG/ML IV BOLUS
INTRAVENOUS | Status: AC
  Filled 2014-10-29: qty 20

## 2014-10-29 MED ORDER — SODIUM CHLORIDE 0.9 % IJ SOLN
INTRAMUSCULAR | Status: DC | PRN
  Administered 2014-10-29: 3 mL

## 2014-10-29 MED ORDER — GLYCOPYRROLATE 0.2 MG/ML IJ SOLN
INTRAMUSCULAR | Status: AC
  Filled 2014-10-29: qty 1

## 2014-10-29 MED ORDER — ONDANSETRON HCL 4 MG/2ML IJ SOLN: 4.0000 mg | Freq: Once | INTRAMUSCULAR | Status: DC | PRN

## 2014-10-29 MED ORDER — FENTANYL CITRATE 0.05 MG/ML IJ SOLN
25.0000 ug | INTRAMUSCULAR | Status: DC
  Administered 2014-10-29: 25 ug via INTRAVENOUS

## 2014-10-29 MED ORDER — SODIUM CHLORIDE 0.9 % IV SOLN: INTRAVENOUS | Status: DC

## 2014-10-29 MED ORDER — PANTOPRAZOLE SODIUM 40 MG PO TBEC
40.0000 mg | DELAYED_RELEASE_TABLET | Freq: Two times a day (BID) | ORAL | Status: DC
  Administered 2014-10-29 – 2014-10-30 (×3): 40 mg via ORAL
  Filled 2014-10-29 (×3): qty 1

## 2014-10-29 MED ORDER — PROPOFOL INFUSION 10 MG/ML OPTIME
INTRAVENOUS | Status: DC | PRN
  Administered 2014-10-29: 175 ug/kg/min via INTRAVENOUS
  Administered 2014-10-29: 150 ug/kg/min via INTRAVENOUS

## 2014-10-29 MED ORDER — MIDAZOLAM HCL 5 MG/5ML IJ SOLN
INTRAMUSCULAR | Status: DC | PRN
  Administered 2014-10-29: 2 mg via INTRAVENOUS

## 2014-10-29 MED ORDER — SUCRALFATE 1 GM/10ML PO SUSP
1.0000 g | Freq: Three times a day (TID) | ORAL | Status: DC
  Administered 2014-10-29 – 2014-10-30 (×6): 1 g via ORAL
  Filled 2014-10-29 (×6): qty 10

## 2014-10-29 MED ORDER — LIDOCAINE HCL (PF) 1 % IJ SOLN
INTRAMUSCULAR | Status: AC
  Filled 2014-10-29: qty 5

## 2014-10-29 SURGICAL SUPPLY — 21 items
BLOCK BITE 60FR ADLT L/F BLUE (MISCELLANEOUS) ×3 IMPLANT
ELECT REM PT RETURN 9FT ADLT (ELECTROSURGICAL)
ELECTRODE REM PT RTRN 9FT ADLT (ELECTROSURGICAL) IMPLANT
FLOOR PAD 36X40 (MISCELLANEOUS) ×3
FORCEP COLD BIOPSY (CUTTING FORCEPS) IMPLANT
FORCEPS BIOP RAD 4 LRG CAP 4 (CUTTING FORCEPS) IMPLANT
FORMALIN 10 PREFIL 20ML (MISCELLANEOUS) IMPLANT
HEMOCLIP INSTINCT (CLIP) ×6 IMPLANT
KIT CLEAN ENDO COMPLIANCE (KITS) ×3 IMPLANT
MANIFOLD NEPTUNE II (INSTRUMENTS) ×3 IMPLANT
NEEDLE SCLEROTHERAPY 25GX240 (NEEDLE) ×3 IMPLANT
PAD FLOOR 36X40 (MISCELLANEOUS) ×1 IMPLANT
PROBE APC STR FIRE (PROBE) IMPLANT
PROBE INJECTION GOLD (MISCELLANEOUS)
PROBE INJECTION GOLD 7FR (MISCELLANEOUS) IMPLANT
SNARE ROTATE MED OVAL 20MM (MISCELLANEOUS) IMPLANT
SNARE SHORT THROW 13M SML OVAL (MISCELLANEOUS) ×3 IMPLANT
SYR 50ML LL SCALE MARK (SYRINGE) ×3 IMPLANT
SYR INFLATION 60ML (SYRINGE) IMPLANT
TUBING IRRIGATION ENDOGATOR (MISCELLANEOUS) ×3 IMPLANT
WATER STERILE IRR 1000ML POUR (IV SOLUTION) ×3 IMPLANT

## 2014-10-29 NOTE — Progress Notes (Signed)
Patient has no complaints this morning. He has not passed any more tarry stool since yesterday morning. Patient developed nausea after taking oxycodone for back pain last night. Hemoglobin was 9.4 yesterday morning; 8.7 yesterday afternoon and 7.8 last night. Patient has received one unit of PRBCs and hemoglobin this morning is 9 g. Patient is undergoing repeat EGD to complete his evaluation. Last EGD 2 days ago was incomplete because he had stomach for blood.

## 2014-10-29 NOTE — Plan of Care (Signed)
Problem: Phase I Progression Outcomes Goal: Hemodynamically stable Outcome: Progressing  Problem: Phase II Progression Outcomes Goal: No active bleeding Outcome: Progressing Goal: Tolerating diet Outcome: Progressing

## 2014-10-29 NOTE — Op Note (Addendum)
EGD PROCEDURE REPORT  PATIENT:  Bryce Benson  MR#:  [id number] Birthdate:  09/09/1986, 28 y.o., male Endoscopist:  Dr. Malissa HippoNajeeb U. Shakaya Bhullar, MD Referred By:  Dr. Elder LoveEstella Y Hernandez, MD  Procedure Date: 10/29/2014  Procedure:   EGD with therapeutic intervention.  Indications:  Patient is 28 year old Hispanic male who underwent EGD for upper GI bleed 2 days ago L to be bleeding from Mallory-Weiss tear. 3 hemoclips were applied. Examination of upper GI tract was incomplete because he had blood in the stomach and duodenum. He has received one unit of PRBCs. He is undergoing repeat EGD to complete examination of upper GI tract.            Informed Consent:  The risks, benefits, alternatives & imponderables which include, but are not limited to, bleeding, infection, perforation, drug reaction and potential missed lesion have been reviewed.  The potential for biopsy, lesion removal, esophageal dilation, etc. have also been discussed.  Questions have been answered.  All parties agreeable.  Please see history & physical in medical record for more information.  Medications:  Monitored anesthesia care. Cetacaine spray topically for oropharyngeal anesthesia  Description of procedure:  The endoscope was introduced through the mouth and advanced to the second portion of the duodenum without difficulty or limitations. The mucosal surfaces were surveyed very carefully during advancement of the scope and upon withdrawal.  Findings:  Esophagus:  Mucosa of the esophagus was normal. Small linear ulcer noted at distal esophagus extending past the GE junction(proximal margin of Mallory-Weiss tear). GEJ:  40 cm Stomach:  Stomach was empty and distended very well with insufflation. Ulcer along the lesser curvature extending to GE junction and one clip still in place at the distal end of ulcer. Mucosa at gastric body and antrum was normal. Single erosion noted at pyloric channel. Pyloric channel was patent. Angularis was  unremarkable. Mallory-Weiss tear and clip recently seen on retroflex view.. Duodenum:  Normal bulbar and post bulbar mucosa.  Therapeutic/Diagnostic Maneuvers Performed:  2 other clips at fallen off. Ulcer noted extending to GE junction. Clip was at the lower end of this ulcer. Clot noted in the middle of this ulcer. 2 indistinct clips applied towards each end. Did not attempt to place clip in the middle. 3 ML of 1 in 10,000 epinephrine was injected around the ulcer.  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 No active bleeding noted. Two instinct clips applied and site also injected with 3 ML of dilute epinephrine. Single erosion at pyloric channel.  Recommendations:  Advance diet. Change pantoprazole to oral route. Sucralfate 1 g by mouth before meals and daily at bedtime. H. pylori serology. Consider monitoring patient hospital for 24 hours.  Georgiann Neider U  10/29/2014  8:07 AM  CC: Dr. Bonnetta BarryNo PCP Per Patient & Dr. No ref. provider found

## 2014-10-29 NOTE — Transfer of Care (Signed)
Immediate Anesthesia Transfer of Care Note  Patient: Bryce Benson  Procedure(s) Performed: Procedure(s) (LRB): ESOPHAGOGASTRODUODENOSCOPY (EGD) WITH PROPOFOL (N/A)  Patient Location: PACU  Anesthesia Type: MAC  Level of Consciousness: awake  Airway & Oxygen Therapy: Patient Spontanous Breathing. Nasal cannula  Post-op Assessment: Report given to PACU RN, Post -op Vital signs reviewed and stable and Patient moving all extremities  Post vital signs: Reviewed and stable  Complications: No apparent anesthesia complications

## 2014-10-29 NOTE — Progress Notes (Signed)
[facility] PROGRESS NOTE  Bryce Benson RUE:[id number] DOB: 07/23/1986 DOA: 10/27/2014 PCP: No PCP Per Patient  Assessment/Plan: Upper GI Bleed -2/2 arterial MW Tear. 3 clips applied to bleeding site. -Repeat EGD today. -Continue BID PPI  Acute Blood Loss Anemia -2/2 GI Bleed -Hb  14-->7.8; received 1 unit of PRBCs 12/6.   Code Status: Full Code Family Communication: Patient only  Disposition Plan: Home when ready; likely in 24-48 hours.   Consultants:  GI, Dr. Karilyn Cotaehman   Antibiotics:  None   Subjective: No further dark stools.  Objective: Filed Vitals:   10/29/14 0857 10/29/14 1000 10/29/14 1100 10/29/14 1203  BP: 139/77 124/76 114/55 113/63  Pulse:  87 76 79  Temp:   97.9 F (36.6 C)   TempSrc:   Oral   Resp:  13 12 13   Height:      Weight:      SpO2: 100% 99% 100% 100%    Intake/Output Summary (Last 24 hours) at 10/29/14 1608 Last data filed at 10/29/14 1348  Gross per 24 hour  Intake   3565 ml  Output   1900 ml  Net   1665 ml   Filed Weights   10/28/14 0500 10/29/14 0500 10/29/14 0700  Weight: 81 kg (178 lb 9.2 oz) 81.2 kg (179 lb 0.2 oz) 81.194 kg (179 lb)    Exam:   General:  AA Ox3  Cardiovascular: RRR, no M/R/G  Respiratory: CTA B  Abdomen: S/NT/ND/+BS  Extremities: no C/C/E/+pulses   Neurologic:  Intact/non-focal  Data Reviewed: Basic Metabolic Panel:  Recent Labs Lab 10/27/14 0530 10/28/14 0535  NA 142 139  K 4.1 3.8  CL 104 106  CO2 25 23  GLUCOSE 102* 103*  BUN 23 18  CREATININE 0.81 0.89  CALCIUM 9.1 8.1*   Liver Function Tests:  Recent Labs Lab 10/27/14 0530  AST 17  ALT 13  ALKPHOS 83  BILITOT 0.6  PROT 7.3  ALBUMIN 4.2   No results for input(s): LIPASE, AMYLASE in the last 168 hours. No results for input(s): AMMONIA in the last 168 hours. CBC:  Recent Labs Lab 10/27/14 0530 10/27/14 0855 10/27/14 1521 10/27/14 2047 10/28/14 0210 10/28/14 1351 10/28/14 2158 10/29/14 0306  10/29/14 1411  WBC 9.5 11.3* 8.4 8.1 6.1  --   --  7.0  --   NEUTROABS 6.4  --   --   --   --   --   --   --   --   HGB 14.1 12.8* 10.6* 10.1* 9.4* 8.7* 7.8* 9.0* 9.7*  HCT 41.5 36.9* 30.8* 29.0* 26.9* 24.8* 23.0* 25.4* 27.5*  MCV 90.8 89.6 89.8 88.7 89.1  --   --  87.9  --   PLT 265 272 249 237 222  --   --  174  --    Cardiac Enzymes: No results for input(s): CKTOTAL, CKMB, CKMBINDEX, TROPONINI in the last 168 hours. BNP (last 3 results) No results for input(s): PROBNP in the last 8760 hours. CBG: No results for input(s): GLUCAP in the last 168 hours.  Recent Results (from the past 240 hour(s))  MRSA PCR Screening     Status: None   Collection Time: 10/27/14  9:47 AM  Result Value Ref Range Status   MRSA by PCR NEGATIVE NEGATIVE Final    Comment:        The GeneXpert MRSA Assay (FDA approved for NASAL specimens only), is one component of a comprehensive MRSA colonization surveillance program. It is  not intended to diagnose MRSA infection nor to guide or monitor treatment for MRSA infections.      Studies: No results found.  Scheduled Meds: . sodium chloride   Intravenous Once  . pantoprazole  40 mg Oral BID AC  . sodium chloride  3 mL Intravenous Q12H  . sucralfate  1 g Oral TID WC & HS   Continuous Infusions: . sodium chloride 100 mL/hr at 10/29/14 0500    Principal Problem:   GI bleed Active Problems:   Acute blood loss anemia    Time spent: 25 minutes. Greater than 50% of this time was spent in direct contact with the patient coordinating care.    Chaya JanHERNANDEZ ACOSTA,Treavor Blomquist  [facility] Pager [phone]  If 7PM-7AM, please contact night-coverage at [email or web address], password [email or web address] 10/29/2014, 4:08 PM  LOS: 2 days

## 2014-10-29 NOTE — Addendum Note (Signed)
Addendum  created 10/29/14 [id number] by Moshe SalisburyKaren E Martavius Lusty, CRNA   Modules edited: Clinical Notes   Clinical Notes:  File: [id number]

## 2014-10-29 NOTE — Progress Notes (Signed)
Pt complains of feeling nauseated. Zofran has been discontinued. Contacted MD, awaiting response. Will continue to monitor pt.

## 2014-10-29 NOTE — Anesthesia Procedure Notes (Signed)
Procedure Name: MAC Date/Time: 10/29/2014 7:30 AM Performed by: Franco NonesYATES, Shyloh Derosa S Pre-anesthesia Checklist: Patient identified, Emergency Drugs available, Suction available, Timeout performed and Patient being monitored Patient Re-evaluated:Patient Re-evaluated prior to inductionOxygen Delivery Method: Non-rebreather mask

## 2014-10-29 NOTE — Progress Notes (Signed)
Orders to transfer pt to med-surg; report called to OmahaJennifer, RN; [facility] in stable condition.

## 2014-10-29 NOTE — Anesthesia Preprocedure Evaluation (Signed)
Anesthesia Evaluation  Patient identified by MRN, date of birth, ID band Patient awake    Reviewed: Allergy & Precautions, H&P , NPO status , Patient's Chart, lab work & pertinent test results  History of Anesthesia Complications Negative for: history of anesthetic complications  Airway Mallampati: I  TM Distance: >3 FB     Dental  (+) Teeth Intact,    Pulmonary  breath sounds clear to auscultation        Cardiovascular Exercise Tolerance: Good Rhythm:Regular Rate:Tachycardia     Neuro/Psych    GI/Hepatic Neg liver ROS, GERD-  Poorly Controlled,  Endo/Other  negative endocrine ROS  Renal/GU negative Renal ROS     Musculoskeletal   Abdominal   Peds  Hematology   Anesthesia Other Findings Hx ETOH abuse  Reproductive/Obstetrics                             Anesthesia Physical Anesthesia Plan  ASA: II  Anesthesia Plan: MAC   Post-op Pain Management:    Induction: Intravenous  Airway Management Planned: Simple Face Mask  Additional Equipment:   Intra-op Plan:   Post-operative Plan:   Informed Consent: I have reviewed the patients History and Physical, chart, labs and discussed the procedure including the risks, benefits and alternatives for the proposed anesthesia with the patient or authorized representative who has indicated his/her understanding and acceptance.     Plan Discussed with: Anesthesiologist  Anesthesia Plan Comments:         Anesthesia Quick Evaluation

## 2014-10-29 NOTE — Anesthesia Postprocedure Evaluation (Signed)
  Anesthesia Post-op Note  Patient: Bryce Benson  Procedure(s) Performed: Procedure(s): ESOPHAGOGASTRODUODENOSCOPY (EGD) WITH PROPOFOL (N/A)  Patient Location: PACU  Anesthesia Type:MAC  Level of Consciousness: awake and patient cooperative  Airway and Oxygen Therapy: Patient Spontanous Breathing  Post-op Pain: moderate  Post-op Assessment: Post-op Vital signs reviewed, Patient's Cardiovascular Status Stable, Respiratory Function Stable, Patent Airway and No signs of Nausea or vomiting  Post-op Vital Signs: Reviewed and stable  Last Vitals:  Filed Vitals:   10/29/14 0807  BP: 140/82  Pulse:   Temp: 37 C  Resp: 22    Complications: No apparent anesthesia complications

## 2014-10-30 DIAGNOSIS — K922 Gastrointestinal hemorrhage, unspecified: Secondary | ICD-10-CM

## 2014-10-30 DIAGNOSIS — K2931 Chronic superficial gastritis with bleeding: Secondary | ICD-10-CM

## 2014-10-30 DIAGNOSIS — K921 Melena: Secondary | ICD-10-CM

## 2014-10-30 LAB — CBC
HCT: 26.1 % — ABNORMAL LOW (ref 39.0–52.0)
Hemoglobin: 9.1 g/dL — ABNORMAL LOW (ref 13.0–17.0)
MCH: 30.7 pg (ref 26.0–34.0)
MCHC: 34.9 g/dL (ref 30.0–36.0)
MCV: 88.2 fL (ref 78.0–100.0)
Platelets: 210 10*3/uL (ref 150–400)
RBC: 2.96 MIL/uL — ABNORMAL LOW (ref 4.22–5.81)
RDW: 13.4 % (ref 11.5–15.5)
WBC: 4.7 10*3/uL (ref 4.0–10.5)

## 2014-10-30 LAB — H. PYLORI ANTIBODY, IGG: H Pylori IgG: 2.12 {ISR} — ABNORMAL HIGH

## 2014-10-30 LAB — BASIC METABOLIC PANEL
Anion gap: 8 (ref 5–15)
BUN: 7 mg/dL (ref 6–23)
CO2: 26 mEq/L (ref 19–32)
Calcium: 8.4 mg/dL (ref 8.4–10.5)
Chloride: 107 mEq/L (ref 96–112)
Creatinine, Ser: 0.86 mg/dL (ref 0.50–1.35)
GFR calc Af Amer: >90 mL/min (ref >90)
GFR calc non Af Amer: >90 mL/min (ref >90)
Glucose, Bld: 107 mg/dL — ABNORMAL HIGH (ref 70–99)
Potassium: 4 mEq/L (ref 3.7–5.3)
Sodium: 141 mEq/L (ref 137–147)

## 2014-10-30 MED ORDER — PANTOPRAZOLE SODIUM 40 MG PO TBEC: 40.0000 mg | DELAYED_RELEASE_TABLET | Freq: Two times a day (BID) | ORAL | Status: DC

## 2014-10-30 MED ORDER — SUCRALFATE 1 GM/10ML PO SUSP: 1.0000 g | Freq: Three times a day (TID) | ORAL | Status: DC

## 2014-10-30 MED ORDER — ONDANSETRON 4 MG PO TBDP: 4.0000 mg | ORAL_TABLET | Freq: Three times a day (TID) | ORAL | Status: DC | PRN

## 2014-10-30 NOTE — Care Management Note (Signed)
UR completed 

## 2014-10-30 NOTE — Progress Notes (Signed)
Pt eating spicy food from home that contained pig feet, peppers and onions. Pt educated on the heart healthy diet. Pt complained of nausea feelings after consuming the food. Pt given zofran and re-educated. Continued to monitor the patient. Nausea was relieved by anti-emetic.

## 2014-10-30 NOTE — Progress Notes (Addendum)
Patient ID: Bryce Benson, male   DOB: 11/04/1986, 28 y.o.   MRN: [id number] Feels better. Nausea but no vomiting.  Sitting up in chair. Feels 50% better. EGD yesterday by Dr Karilyn Cotaehman: Impression: Mallory-Weiss tear with single clip in place and there was small clot covering middle of this tear or ulcer. No active bleeding noted. Two instinct clips applied and site also injected with 3 ML of dilute epinephrine. Single erosion at pyloric channel.    CBC Latest Ref Rng 10/30/2014 10/29/2014 10/29/2014  WBC 4.0 - 10.5 K/uL 4.7 - 7.0  Hemoglobin 13.0 - 17.0 g/dL 2.9(B9.1(L) 2.8(U9.7(L) 1.3(K9.0(L)  Hematocrit 39.0 - 52.0 % 26.1(L) 27.5(L) 25.4(L)  Platelets 150 - 400 K/uL 210 - 174   Alert. Sitting in chair. Lungs are clear. Heart rate regular. No abdominal pain. BS+. No edema to lower extremities.   Assement: Mallory Weisss with therapetuic clips applied. Plan: Protonix 40mg  BID, Carafate QID. H and H this Friday. Will need OV in 2 week. No etoh or Goody Powder's. H. Pylori pending.   GI attending note; Patient has no complaints; he is going home. Will contact patient with HP result when ready.

## 2014-10-30 NOTE — Care Management Note (Signed)
    Page 1 of 1   10/30/2014     2:21:20 PM CARE MANAGEMENT NOTE 10/30/2014  Patient:  Bryce Benson,Bryce Benson   Account Number:  [id number]  Date Initiated:  10/29/2014  Documentation initiated by:  CHILDRESS,JESSICA  Subjective/Objective Assessment:   Pt is from home with self care. pt independent with ADL's. No HH serivces, DME's or med needs prior to admission. Pt is inusred but hasn't gotten card in the mail yet.     Action/Plan:   Pt plans to discharge home with self care. No CM needs identified at this time.   Anticipated DC Date:  10/30/2014   Anticipated DC Plan:  HOME/SELF CARE      DC Planning Services  CM consult  MATCH Program      Choice offered to / List presented to:             Status of service:  Completed, signed off Medicare Important Message given?   (If response is "NO", the following Medicare IM given date fields will be blank) Date Medicare IM given:   Medicare IM given by:   Date Additional Medicare IM given:   Additional Medicare IM given by:    Discharge Disposition:  HOME/SELF CARE  Per UR Regulation:    If discussed at Long Length of Stay Meetings, dates discussed:    Comments:  10/30/2014 1000 Kathyrn SheriffJessica Childress, RN, MSN, PCCN Pt discharged home with self care. Pt's insurance is not currenly active bc he has recently started new job. Pt given MATCH voucher for medication assistance. No further CM needs at the time of discharge.  10/29/2014 1300 Kathyrn SheriffJessica Childress, RN, MSN, [facility]

## 2014-10-30 NOTE — Plan of Care (Signed)
Problem: Phase I Progression Outcomes Goal: Hemodynamically stable Outcome: Progressing  Problem: Phase II Progression Outcomes Goal: No active bleeding Outcome: Progressing Goal: Hemodynamically stable Outcome: Progressing Goal: H&H stablized < 1gm drop in 24 hrs Outcome: Progressing Goal: Progress activity as tolerated unless otherwise ordered Outcome: Progressing Goal: Tolerating diet Outcome: Progressing

## 2014-10-30 NOTE — Discharge Summary (Signed)
Physician Discharge Summary  Bryce Benson WUJ:[id number] DOB: 01/26/1986 DOA: 10/27/2014  PCP:Chriss Czar No PCP Per Patient  Admit date: 10/27/2014 Discharge date: 10/30/2014  Time spent: 45 minutes  Recommendations for Outpatient Follow-up:  -Will be discharged home today. -Will follow up with Dr. Karilyn Cotaehman in 3 days for repeat CBC.   Discharge Diagnoses:  Principal Problem:   GI bleed Active Problems:   Acute blood loss anemia   Discharge Condition: Stable and improved  Filed Weights   10/28/14 0500 10/29/14 0500 10/29/14 0700  Weight: 81 kg (178 lb 9.2 oz) 81.2 kg (179 lb 0.2 oz) 81.194 kg (179 lb)    History of present illness:  Patient is a 28 year old man without past medical history other than tonsillectomy as a child who presented to the hospital today with Nausea, vomiting, black stools, black emesis. He states that about 3:30 this morning he woke up with the feeling that he needed to have a bowel movement. When he went to the bathroom he noticed that his stool was very dark. He subsequently became very nauseous and when he went to vomit had dark-colored emesis. He had 3 episodes at home and then came to the hospital were he continued to have dark-colored emesis as well as some red blood-tinged emesis in the emergency department. He was noted to be tachycardic to the 110s, hemodynamically stable, hemoglobin of 14. Was given 40 mg of IV Protonix and we have been asked to admit him for further evaluation and management. Of note, he admits to taking Goody powders every day as well as drinking 2-3 beers every night.  Hospital Course:   Upper GI Bleed -2/2 arterial MW Tear. 3 clips applied to bleeding site. -Continue BID PPI/sucralfate. -NO further evidence of bleeding.  Acute Blood Loss Anemia -2/2 GI Bleed -Hb 14-->7.8; received 1 unit of PRBCs 12/6. -Hb has been  Stable at 9-9.1 since transfusion.  Procedures:  EGD   Consultations:  GI, Dr. Karilyn Cotaehman  Discharge  Instructions     Medication List    TAKE these medications        ondansetron 4 MG disintegrating tablet  Commonly known as:  ZOFRAN ODT  Take 1 tablet (4 mg total) by mouth every 8 (eight) hours as needed for nausea or vomiting.     pantoprazole 40 MG tablet  Commonly known as:  PROTONIX  Take 1 tablet (40 mg total) by mouth 2 (two) times daily before a meal.     sucralfate 1 GM/10ML suspension  Commonly known as:  CARAFATE  Take 10 mLs (1 g total) by mouth 4 (four) times daily -  with meals and at bedtime.       No Known Allergies     Follow-up Information    Follow up with Malissa HippoEHMAN,NAJEEB U, MD.   Specialty:  Gastroenterology   Why:  as scheduled by him   Contact information:   [address], [address] [place] [address] [phone]        The results of significant diagnostics from this hospitalization (including imaging, microbiology, ancillary and laboratory) are listed below for reference.    Significant Diagnostic Studies: No results found.  Microbiology: Recent Results (from the past 240 hour(s))  MRSA PCR Screening     Status: None   Collection Time: 10/27/14  9:47 AM  Result Value Ref Range Status   MRSA by PCR NEGATIVE NEGATIVE Final    Comment:        The GeneXpert MRSA  Assay (FDA approved for NASAL specimens only), is one component of a comprehensive MRSA colonization surveillance program. It is not intended to diagnose MRSA infection nor to guide or monitor treatment for MRSA infections.      Labs: Basic Metabolic Panel:  Recent Labs Lab 10/27/14 0530 10/28/14 0535 10/30/14 0605  NA 142 139 141  K 4.1 3.8 4.0  CL 104 106 107  CO2 25 23 26   GLUCOSE 102* 103* 107*  BUN 23 18 7   CREATININE 0.81 0.89 0.86  CALCIUM 9.1 8.1* 8.4   Liver Function Tests:  Recent Labs Lab 10/27/14 0530  AST 17  ALT 13  ALKPHOS 83  BILITOT 0.6  PROT 7.3  ALBUMIN 4.2   No results for input(s): LIPASE, AMYLASE in the last 168 hours. No  results for input(s): AMMONIA in the last 168 hours. CBC:  Recent Labs Lab 10/27/14 0530  10/27/14 1521 10/27/14 2047 10/28/14 0210 10/28/14 1351 10/28/14 2158 10/29/14 0306 10/29/14 1411 10/30/14 0605  WBC 9.5  < > 8.4 8.1 6.1  --   --  7.0  --  4.7  NEUTROABS 6.4  --   --   --   --   --   --   --   --   --   HGB 14.1  < > 10.6* 10.1* 9.4* 8.7* 7.8* 9.0* 9.7* 9.1*  HCT 41.5  < > 30.8* 29.0* 26.9* 24.8* 23.0* 25.4* 27.5* 26.1*  MCV 90.8  < > 89.8 88.7 89.1  --   --  87.9  --  88.2  PLT 265  < > 249 237 222  --   --  174  --  210  < > = values in this interval not displayed. Cardiac Enzymes: No results for input(s): CKTOTAL, CKMB, CKMBINDEX, TROPONINI in the last 168 hours. BNP: BNP (last 3 results) No results for input(s): PROBNP in the last 8760 hours. CBG: No results for input(s): GLUCAP in the last 168 hours.     SignedChaya Jan:  HERNANDEZ ACOSTA,ESTELA  [facility] Pager: [phone] 10/30/2014, 12:48 PM

## 2014-10-31 ENCOUNTER — Encounter (HOSPITAL_COMMUNITY): Payer: Self-pay | Admitting: Internal Medicine

## 2014-10-31 LAB — TYPE AND SCREEN
ABO/RH(D): O POS
Antibody Screen: NEGATIVE
Unit division: 0
Unit division: 0

## 2014-11-02 ENCOUNTER — Telehealth (INDEPENDENT_AMBULATORY_CARE_PROVIDER_SITE_OTHER): Payer: Self-pay | Admitting: *Deleted

## 2014-11-02 DIAGNOSIS — D649 Anemia, unspecified: Secondary | ICD-10-CM

## 2014-11-02 NOTE — Telephone Encounter (Signed)
Per Dr.Rehman the patient will need to have labs drawn. 

## 2014-11-03 LAB — HEMOGLOBIN AND HEMATOCRIT, BLOOD
HCT: 31.8 % — ABNORMAL LOW (ref 39.0–52.0)
Hemoglobin: 10.8 g/dL — ABNORMAL LOW (ref 13.0–17.0)

## 2014-11-08 ENCOUNTER — Telehealth (INDEPENDENT_AMBULATORY_CARE_PROVIDER_SITE_OTHER): Payer: Self-pay | Admitting: *Deleted

## 2014-11-08 DIAGNOSIS — D649 Anemia, unspecified: Secondary | ICD-10-CM

## 2014-11-08 NOTE — Telephone Encounter (Signed)
Per Dr.Rehman the patient will need to have labs drawn in 4 weeks.   

## 2014-11-20 ENCOUNTER — Other Ambulatory Visit (INDEPENDENT_AMBULATORY_CARE_PROVIDER_SITE_OTHER): Payer: Self-pay | Admitting: *Deleted

## 2014-11-20 ENCOUNTER — Encounter (INDEPENDENT_AMBULATORY_CARE_PROVIDER_SITE_OTHER): Payer: Self-pay | Admitting: *Deleted

## 2014-11-20 DIAGNOSIS — D649 Anemia, unspecified: Secondary | ICD-10-CM

## 2014-11-29 ENCOUNTER — Ambulatory Visit (INDEPENDENT_AMBULATORY_CARE_PROVIDER_SITE_OTHER): Payer: Self-pay | Admitting: Internal Medicine

## 2014-11-29 ENCOUNTER — Encounter (INDEPENDENT_AMBULATORY_CARE_PROVIDER_SITE_OTHER): Payer: Self-pay | Admitting: Internal Medicine

## 2014-11-29 VITALS — BP 108/74 | HR 72 | Temp 98.0°F | Ht 69.0 in | Wt 178.1 lb

## 2014-11-29 DIAGNOSIS — K922 Gastrointestinal hemorrhage, unspecified: Secondary | ICD-10-CM

## 2014-11-29 DIAGNOSIS — B9681 Helicobacter pylori [H. pylori] as the cause of diseases classified elsewhere: Secondary | ICD-10-CM

## 2014-11-29 DIAGNOSIS — A048 Other specified bacterial intestinal infections: Secondary | ICD-10-CM

## 2014-11-29 LAB — CBC
HCT: 40.9 % (ref 39.0–52.0)
Hemoglobin: 13.8 g/dL (ref 13.0–17.0)
MCH: 28.9 pg (ref 26.0–34.0)
MCHC: 33.7 g/dL (ref 30.0–36.0)
MCV: 85.6 fL (ref 78.0–100.0)
MPV: 9.9 fL (ref 8.6–12.4)
Platelets: 315 10*3/uL (ref 150–400)
RBC: 4.78 MIL/uL (ref 4.22–5.81)
RDW: 13.8 % (ref 11.5–15.5)
WBC: 4.8 10*3/uL (ref 4.0–10.5)

## 2014-11-29 NOTE — Patient Instructions (Addendum)
CBC today. Rx for Pylera to his pharmacy.

## 2014-11-29 NOTE — Progress Notes (Signed)
Subjective:    Patient ID: Chriss Czarablo L Celmer, male    DOB: 12/23/1985, 29 y.o.   MRN: [id number]  HPI Here today for f/u after recent admission in December to [facility] for upper GI bleed. Admitted with melena and vomiting blood.  He had been taking Goody powder for headache and back pain on a frequent basis. He usually took at least one a day. He received one unit of PRBCs wihile in the hospital. He tells me he is doing good. His BMs are brown in color. Appetite is good. No acid reflux.  He says he has stopped eating hot food (spicy). He is not taking any Good Powders anymore. Appetite is good. No weight loss.  10/29/2014 H.pylori positive.  10/29/2014 EGD: Dr. Karilyn Cotaehman:   Indications: Patient is 29 year old Hispanic male who underwent EGD for upper GI bleed 2 days ago L to be bleeding from Mallory-Weiss tear. 3 hemoclips were applied. Examination of upper GI tract was incomplete because he had blood in the stomach and duodenum. He has received one unit of PRBCs. He is undergoing repeat EGD to complete  Impression: Mallory-Weiss tear with single clip in place and there was small clot covering middle of this tear or ulcer. No active bleeding noted. Two instinct clips applied and site also injected with 3 ML of dilute epinephrine. Single erosion at pyloric channel.  10/27/2014 EGD with therapeutic intervention: Procedure: EGD with therapeutic intervention. Indications: Patient is 29 year old Hispanic male who presents with upper GI bleed. He drinks beer regularly and takes Goody powder. Impression: Mallory-Weiss tear with stigmata of active bleed. Three instinct clips applied and hemostasis. Examination of upper GI tract was incomplete because of fresh blood in large clot in the stomach.    CBC    Component Value Date/Time   WBC 4.7 10/30/2014 0605   RBC 2.96* 10/30/2014 0605   HGB  10.8* 11/02/2014 1033   HCT 31.8* 11/02/2014 1033   PLT 210 10/30/2014 0605   MCV 88.2 10/30/2014 0605   MCH 30.7 10/30/2014 0605   MCHC 34.9 10/30/2014 0605   RDW 13.4 10/30/2014 0605   LYMPHSABS 1.7 10/27/2014 0530   MONOABS 1.2* 10/27/2014 0530   EOSABS 0.2 10/27/2014 0530   BASOSABS 0.0 10/27/2014 0530        Review of Systems Past Medical History  Diagnosis Date  . Kidney stones     Past Surgical History  Procedure Laterality Date  . Tonsillectomy    . Esophagogastroduodenoscopy (egd) with propofol N/A 10/27/2014    Procedure: ESOPHAGOGASTRODUODENOSCOPY (EGD) WITH PROPOFOL WITH THREE  CLIPS APPLIED;  Surgeon: Malissa HippoNajeeb U Rehman, MD;  Location: [facility];  Service: Endoscopy;  Laterality: N/A;  . Esophagogastroduodenoscopy (egd) with propofol N/A 10/29/2014    Procedure: ESOPHAGOGASTRODUODENOSCOPY (EGD) WITH PROPOFOL;  Surgeon: Malissa HippoNajeeb U Rehman, MD;  Location: [facility];  Service: Endoscopy;  Laterality: N/A;    No Known Allergies  No current outpatient prescriptions on file prior to visit.   No current facility-administered medications on file prior to visit.    Single. 5 children in good health.    Objective:   Physical Exam  Filed Vitals:   11/29/14 0855  Height: 5\' 9"  (1.753 m)  Weight: 178 lb 1.6 oz (80.786 kg)   Alert and oriented. Skin warm and dry. Oral mucosa is moist.   . Sclera anicteric, conjunctivae is pink. Thyroid not enlarged. No cervical lymphadenopathy. Lungs clear. Heart regular rate and rhythm.  Abdomen is soft. Bowel sounds are positive. No hepatomegaly. No  abdominal masses felt. No tenderness.  No edema to lower extremities.          Assessment & Plan:  Melena. H. pylori positive. Hx of Mallory Weiss tear.  Will treat H.pylori with Prilosec  BID x 10 days, Clarithromycin  BID  X 10 days, Amoxicillin 1gm BID x 10 days CBC today.  OV in 6 months No NSAIDs.

## 2015-02-28 ENCOUNTER — Ambulatory Visit (INDEPENDENT_AMBULATORY_CARE_PROVIDER_SITE_OTHER): Payer: Self-pay | Admitting: Internal Medicine

## 2015-04-12 ENCOUNTER — Ambulatory Visit (INDEPENDENT_AMBULATORY_CARE_PROVIDER_SITE_OTHER): Payer: Self-pay | Admitting: Internal Medicine

## 2015-04-24 ENCOUNTER — Encounter (INDEPENDENT_AMBULATORY_CARE_PROVIDER_SITE_OTHER): Payer: Self-pay | Admitting: *Deleted

## 2015-12-08 ENCOUNTER — Inpatient Hospital Stay (HOSPITAL_COMMUNITY)
Admission: EM | Admit: 2015-12-08 | Discharge: 2015-12-10 | DRG: 370 | Disposition: A | Discharge status: Home / Self Care | Payer: Medicaid Other | Attending: Internal Medicine | Admitting: Internal Medicine

## 2015-12-08 ENCOUNTER — Encounter (HOSPITAL_COMMUNITY): Payer: Self-pay | Admitting: *Deleted

## 2015-12-08 DIAGNOSIS — F1010 Alcohol abuse, uncomplicated: Secondary | ICD-10-CM | POA: Diagnosis present

## 2015-12-08 DIAGNOSIS — Z87442 Personal history of urinary calculi: Secondary | ICD-10-CM

## 2015-12-08 DIAGNOSIS — K922 Gastrointestinal hemorrhage, unspecified: Secondary | ICD-10-CM | POA: Diagnosis present

## 2015-12-08 DIAGNOSIS — K920 Hematemesis: Secondary | ICD-10-CM | POA: Diagnosis present

## 2015-12-08 DIAGNOSIS — K226 Gastro-esophageal laceration-hemorrhage syndrome: Principal | ICD-10-CM | POA: Diagnosis present

## 2015-12-08 MED ORDER — SODIUM CHLORIDE 0.9 % IV SOLN
8.0000 mg/h | INTRAVENOUS | Status: DC
  Administered 2015-12-09 (×3): 8 mg/h via INTRAVENOUS
  Filled 2015-12-08 (×7): qty 80

## 2015-12-08 MED ORDER — SODIUM CHLORIDE 0.9 % IV BOLUS (SEPSIS)
1000.0000 mL | Freq: Once | INTRAVENOUS | Status: AC
  Administered 2015-12-09: 1000 mL via INTRAVENOUS

## 2015-12-08 MED ORDER — SODIUM CHLORIDE 0.9 % IV SOLN
80.0000 mg | Freq: Once | INTRAVENOUS | Status: AC
  Administered 2015-12-09: 80 mg via INTRAVENOUS
  Filled 2015-12-08: qty 80

## 2015-12-08 MED ORDER — PANTOPRAZOLE SODIUM 40 MG IV SOLR
40.0000 mg | Freq: Two times a day (BID) | INTRAVENOUS | Status: DC
Start: 2015-12-12 — End: 2015-12-10

## 2015-12-08 NOTE — ED Notes (Signed)
Pt reports he was sitting having a couple beers and became very nauseated and vomited blood.  Reports 3 episodes.

## 2015-12-08 NOTE — ED Provider Notes (Signed)
CSN: [id number]     Arrival date & time 12/08/15  2326 History  By signing my name below, I, Murriel Hopper, attest that this documentation has been prepared under the direction and in the presence of Devoria Albe, MD at 2334 Electronically Signed: Murriel Hopper, ED Scribe. 12/08/2015. 11:48 PM.    No chief complaint on file.    The history is provided by the patient. No language interpreter was used.   HPI Comments: Bryce Benson is a 30 y.o. male with a hx of a Esophagogastroduodenoscopy for Mallory Weise Tear and a ulcer at the GE junction and the pyloric channel requiring 2 units of blood to be transfused, who presents to the Emergency Department complaining of constant nausea all day today with associated hematemesis, and a constant burning chest pain that has been present since this morning. Pt states he vomited 4x tonight after drinking a few beers and noticed there was blood in his vomit. He states there was blood the first time he vomited. Pt states he takes Tylenol regularly due to his previous surgery. He states with his prior GI bleed he had been taking Goody powders. Pt also reports intermittent diarrhea earlier today, but denies black, tarry stools. Pt states he did cocaine yesterday for the first time in about 10-11 years, and reports he drinks around 3-6 beers 25 ounces each every Friday, Saturday, and Sunday (today). Pt denies smoking. Pt denies any allergies.   PCP [facility]  GI Dr Karilyn Cota   Past Medical History  Diagnosis Date  . Kidney stones    Past Surgical History  Procedure Laterality Date  . Tonsillectomy    . Esophagogastroduodenoscopy (egd) with propofol N/A 10/27/2014    Procedure: ESOPHAGOGASTRODUODENOSCOPY (EGD) WITH PROPOFOL WITH THREE  CLIPS APPLIED;  Surgeon: Malissa Hippo, MD;  Location: [facility];  Service: Endoscopy;  Laterality: N/A;  . Esophagogastroduodenoscopy (egd) with propofol N/A 10/29/2014    Procedure: ESOPHAGOGASTRODUODENOSCOPY (EGD)  WITH PROPOFOL;  Surgeon: Malissa Hippo, MD;  Location: [facility];  Service: Endoscopy;  Laterality: N/A;   History reviewed. No pertinent family history. Social History  Substance Use Topics  . Smoking status: Never Smoker   . Smokeless tobacco: None  . Alcohol Use: 0.0 oz/week    0 Standard drinks or equivalent per week  employed Drinks alcohol over the weekends  Review of Systems  Gastrointestinal: Positive for nausea, vomiting, abdominal pain and diarrhea.  All other systems reviewed and are negative.     Allergies  Review of patient's allergies indicates no known allergies.  Home Medications   Prior to Admission medications   Not on File   BP 150/98 mmHg  Pulse 120  Temp(Src) 98.3 F (36.8 C) (Oral)  Resp 18  Ht 5\' 9"  (1.753 m)  Wt 160 lb (72.576 kg)  BMI 23.62 kg/m2  SpO2 100%  Vital signs normal except for tachycardia  Physical Exam  Constitutional: He is oriented to person, place, and time. He appears well-developed and well-nourished.  Non-toxic appearance. He does not appear ill. No distress.  Appears to be intoxicated  HENT:  Head: Normocephalic and atraumatic.  Right Ear: External ear normal.  Left Ear: External ear normal.  Nose: Nose normal. No mucosal edema or rhinorrhea.  Mouth/Throat: Oropharynx is clear and moist and mucous membranes are normal. No dental abscesses or uvula swelling.  Eyes: Conjunctivae and EOM are normal. Pupils are equal, round, and reactive to light.  Neck: Normal range of motion and full passive  range of motion without pain. Neck supple.  Cardiovascular: Normal rate, regular rhythm and normal heart sounds.  Exam reveals no gallop and no friction rub.   No murmur heard. Pulmonary/Chest: Effort normal and breath sounds normal. No respiratory distress. He has no wheezes. He has no rhonchi. He has no rales. He exhibits no tenderness and no crepitus.    Area of burning noted  Abdominal: Soft. Normal appearance and bowel sounds  are normal. He exhibits no distension. There is tenderness in the epigastric area. There is no rebound and no guarding.    Epigastric tenderness   Musculoskeletal: Normal range of motion. He exhibits no edema or tenderness.  Moves all extremities well.   Neurological: He is alert and oriented to person, place, and time. He has normal strength. No cranial nerve deficit.  Skin: Skin is warm, dry and intact. No rash noted. No erythema. No pallor.  Psychiatric: He has a normal mood and affect. His speech is normal and behavior is normal. His mood appears not anxious.  Nursing note and vitals reviewed.   ED Course  Procedures (including critical care time)  Medications  pantoprazole (PROTONIX) 80 mg in sodium chloride 0.9 % 250 mL (0.32 mg/mL) infusion (8 mg/hr Intravenous New Bag/Given 12/09/15 0039)  pantoprazole (PROTONIX) injection 40 mg (not administered)  sodium chloride 0.9 % bolus 1,000 mL (0 mLs Intravenous Stopped 12/09/15 0038)  pantoprazole (PROTONIX) 80 mg in sodium chloride 0.9 % 100 mL IVPB (0 mg Intravenous Stopped 12/09/15 0038)     DIAGNOSTIC STUDIES: Oxygen Saturation is 100% on room air, normal by my interpretation.    COORDINATION OF CARE: 11:47 PM Discussed treatment plan with pt at bedside and pt agreed to plan. Patient was given a liter of IV fluids because of his tachycardia of 120. He was started on a Protonix bolus and drip for presumed upper GI bleeding from ulcer which he has had in the past..  0020 AM states his burning is improving, HR is improving after IV fluids. Waiting for labs to result.   01:46 Dr Sharl MaLama, admit to observation   Labs Review  Results for orders placed or performed during the hospital encounter of 12/08/15  Comprehensive metabolic panel  Result Value Ref Range   Sodium 142 135 - 145 mmol/L   Potassium 3.5 3.5 - 5.1 mmol/L   Chloride 104 101 - 111 mmol/L   CO2 24 22 - 32 mmol/L   Glucose, Bld 102 (H) 65 - 99 mg/dL   BUN 9 6 - 20  mg/dL   Creatinine, Ser 9.810.82 0.61 - 1.24 mg/dL   Calcium 9.5 8.9 - 19.110.3 mg/dL   Total Protein 7.9 6.5 - 8.1 g/dL   Albumin 4.9 3.5 - 5.0 g/dL   AST 20 15 - 41 U/L   ALT 16 (L) 17 - 63 U/L   Alkaline Phosphatase 115 38 - 126 U/L   Total Bilirubin 0.7 0.3 - 1.2 mg/dL   GFR calc non Af Amer >60 >60 mL/min   GFR calc Af Amer >60 >60 mL/min   Anion gap 14 5 - 15  CBC with Differential  Result Value Ref Range   WBC 9.3 4.0 - 10.5 K/uL   RBC 5.32 4.22 - 5.81 MIL/uL   Hemoglobin 16.5 13.0 - 17.0 g/dL   HCT 47.846.6 29.539.0 - 62.152.0 %   MCV 87.6 78.0 - 100.0 fL   MCH 31.0 26.0 - 34.0 pg   MCHC 35.4 30.0 - 36.0 g/dL   RDW  12.5 11.5 - 15.5 %   Platelets 297 150 - 400 K/uL   Neutrophils Relative % 74 %   Neutro Abs 6.9 1.7 - 7.7 K/uL   Lymphocytes Relative 17 %   Lymphs Abs 1.6 0.7 - 4.0 K/uL   Monocytes Relative 8 %   Monocytes Absolute 0.8 0.1 - 1.0 K/uL   Eosinophils Relative 1 %   Eosinophils Absolute 0.1 0.0 - 0.7 K/uL   Basophils Relative 0 %   Basophils Absolute 0.0 0.0 - 0.1 K/uL  Lipase, blood  Result Value Ref Range   Lipase 24 11 - 51 U/L  Ethanol  Result Value Ref Range   Alcohol, Ethyl (B) 275 (H) <5 mg/dL  Sample to Blood Bank  Result Value Ref Range   Blood Bank Specimen [id number]    Sample Expiration 12/10/2015     Laboratory interpretation all normal   Results for orders placed or performed in visit on 11/29/14  CBC  Result Value Ref Range   WBC 4.8 4.0 - 10.5 K/uL   RBC 4.78 4.22 - 5.81 MIL/uL   Hemoglobin 13.8 13.0 - 17.0 g/dL   HCT 16.1 09.6 - 04.5 %   MCV 85.6 78.0 - 100.0 fL   MCH 28.9 26.0 - 34.0 pg   MCHC 33.7 30.0 - 36.0 g/dL   RDW 40.9 81.1 - 91.4 %   Platelets 315 150 - 400 K/uL   MPV 9.9 8.6 - 12.4 fL        MDM   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 He received 2 units of blood. Tonight he presents after drinking heavily and has had substernal burning  and gross vomiting up blood 4, stating there was blood with the first episode. Of interest patient had lab work last week done by his PCP with a hemoglobin of 13.8, tonight it is 16.5. I think this is most likely hemoconcentration and his hemoglobin will drop. Due to his history of bleeding requiring multiple clips it was felt he should be admitted for further observation and possible endoscopy.    Final diagnoses:  Upper gastrointestinal bleeding    Plan admission  Devoria Albe, MD, FACEP   I personally performed the services described in this documentation, which was scribed in my presence. The recorded information has been reviewed and considered.  Devoria Albe, MD, Concha Pyo, MD 12/09/15 [phone]

## 2015-12-09 ENCOUNTER — Encounter (HOSPITAL_COMMUNITY): Payer: Self-pay | Admitting: *Deleted

## 2015-12-09 DIAGNOSIS — K922 Gastrointestinal hemorrhage, unspecified: Secondary | ICD-10-CM

## 2015-12-09 DIAGNOSIS — F10129 Alcohol abuse with intoxication, unspecified: Secondary | ICD-10-CM

## 2015-12-09 DIAGNOSIS — R110 Nausea: Secondary | ICD-10-CM

## 2015-12-09 DIAGNOSIS — K920 Hematemesis: Secondary | ICD-10-CM | POA: Diagnosis present

## 2015-12-09 DIAGNOSIS — Z8719 Personal history of other diseases of the digestive system: Secondary | ICD-10-CM

## 2015-12-09 DIAGNOSIS — Z1159 Encounter for screening for other viral diseases: Secondary | ICD-10-CM

## 2015-12-09 LAB — COMPREHENSIVE METABOLIC PANEL
ALT: 15 U/L — ABNORMAL LOW (ref 17–63)
ALT: 16 U/L — ABNORMAL LOW (ref 17–63)
AST: 19 U/L (ref 15–41)
AST: 20 U/L (ref 15–41)
Albumin: 4.1 g/dL (ref 3.5–5.0)
Albumin: 4.9 g/dL (ref 3.5–5.0)
Alkaline Phosphatase: 103 U/L (ref 38–126)
Alkaline Phosphatase: 115 U/L (ref 38–126)
Anion gap: 11 (ref 5–15)
Anion gap: 14 (ref 5–15)
BUN: 8 mg/dL (ref 6–20)
BUN: 9 mg/dL (ref 6–20)
CO2: 23 mmol/L (ref 22–32)
CO2: 24 mmol/L (ref 22–32)
Calcium: 8.7 mg/dL — ABNORMAL LOW (ref 8.9–10.3)
Calcium: 9.5 mg/dL (ref 8.9–10.3)
Chloride: 104 mmol/L (ref 101–111)
Chloride: 108 mmol/L (ref 101–111)
Creatinine, Ser: 0.82 mg/dL (ref 0.61–1.24)
Creatinine, Ser: 0.85 mg/dL (ref 0.61–1.24)
GFR calc Af Amer: >60 mL/min (ref >60)
GFR calc Af Amer: >60 mL/min (ref >60)
GFR calc non Af Amer: >60 mL/min (ref >60)
GFR calc non Af Amer: >60 mL/min (ref >60)
Glucose, Bld: 102 mg/dL — ABNORMAL HIGH (ref 65–99)
Glucose, Bld: 99 mg/dL (ref 65–99)
Potassium: 3.5 mmol/L (ref 3.5–5.1)
Potassium: 4.1 mmol/L (ref 3.5–5.1)
Sodium: 142 mmol/L (ref 135–145)
Sodium: 142 mmol/L (ref 135–145)
Total Bilirubin: 0.7 mg/dL (ref 0.3–1.2)
Total Bilirubin: 0.7 mg/dL (ref 0.3–1.2)
Total Protein: 6.9 g/dL (ref 6.5–8.1)
Total Protein: 7.9 g/dL (ref 6.5–8.1)

## 2015-12-09 LAB — CBC WITH DIFFERENTIAL/PLATELET
Basophils Absolute: 0 10*3/uL (ref 0.0–0.1)
Basophils Relative: 0 %
Eosinophils Absolute: 0.1 10*3/uL (ref 0.0–0.7)
Eosinophils Relative: 1 %
HCT: 46.6 % (ref 39.0–52.0)
Hemoglobin: 16.5 g/dL (ref 13.0–17.0)
Lymphocytes Relative: 17 %
Lymphs Abs: 1.6 10*3/uL (ref 0.7–4.0)
MCH: 31 pg (ref 26.0–34.0)
MCHC: 35.4 g/dL (ref 30.0–36.0)
MCV: 87.6 fL (ref 78.0–100.0)
Monocytes Absolute: 0.8 10*3/uL (ref 0.1–1.0)
Monocytes Relative: 8 %
Neutro Abs: 6.9 10*3/uL (ref 1.7–7.7)
Neutrophils Relative %: 74 %
Platelets: 297 10*3/uL (ref 150–400)
RBC: 5.32 MIL/uL (ref 4.22–5.81)
RDW: 12.5 % (ref 11.5–15.5)
WBC: 9.3 10*3/uL (ref 4.0–10.5)

## 2015-12-09 LAB — CBC
HCT: 44 % (ref 39.0–52.0)
Hemoglobin: 15 g/dL (ref 13.0–17.0)
MCH: 30.6 pg (ref 26.0–34.0)
MCHC: 34.1 g/dL (ref 30.0–36.0)
MCV: 89.8 fL (ref 78.0–100.0)
Platelets: 274 10*3/uL (ref 150–400)
RBC: 4.9 MIL/uL (ref 4.22–5.81)
RDW: 12.7 % (ref 11.5–15.5)
WBC: 10 10*3/uL (ref 4.0–10.5)

## 2015-12-09 LAB — HEMOGLOBIN AND HEMATOCRIT, BLOOD
HCT: 42.6 % (ref 39.0–52.0)
HCT: 44.2 % (ref 39.0–52.0)
HCT: 42.6 % (ref 39.0–52.0)
Hemoglobin: 14.8 g/dL (ref 13.0–17.0)
Hemoglobin: 15.3 g/dL (ref 13.0–17.0)
Hemoglobin: 14.5 g/dL (ref 13.0–17.0)

## 2015-12-09 LAB — LIPASE, BLOOD: Lipase: 24 U/L (ref 11–51)

## 2015-12-09 LAB — SAMPLE TO BLOOD BANK

## 2015-12-09 LAB — MRSA PCR SCREENING: MRSA by PCR: NEGATIVE

## 2015-12-09 LAB — ETHANOL: Alcohol, Ethyl (B): 275 mg/dL — ABNORMAL HIGH (ref <5)

## 2015-12-09 MED ORDER — LORAZEPAM 2 MG/ML IJ SOLN: 0.0000 mg | Freq: Two times a day (BID) | INTRAMUSCULAR | Status: DC

## 2015-12-09 MED ORDER — THIAMINE HCL 100 MG/ML IJ SOLN
100.0000 mg | Freq: Every day | INTRAMUSCULAR | Status: DC
  Administered 2015-12-09: 100 mg via INTRAVENOUS
  Filled 2015-12-09: qty 2

## 2015-12-09 MED ORDER — PANTOPRAZOLE SODIUM 40 MG IV SOLR
INTRAVENOUS | Status: AC
  Filled 2015-12-09: qty 160

## 2015-12-09 MED ORDER — ACETAMINOPHEN 325 MG PO TABS
650.0000 mg | ORAL_TABLET | Freq: Four times a day (QID) | ORAL | Status: DC | PRN
  Administered 2015-12-09 (×2): 650 mg via ORAL
  Filled 2015-12-09 (×2): qty 2

## 2015-12-09 MED ORDER — ONDANSETRON HCL 4 MG/2ML IJ SOLN
4.0000 mg | Freq: Four times a day (QID) | INTRAMUSCULAR | Status: DC
  Administered 2015-12-09 (×2): 4 mg via INTRAVENOUS
  Filled 2015-12-09 (×2): qty 2

## 2015-12-09 MED ORDER — LORAZEPAM 0.5 MG PO TABS
1.0000 mg | ORAL_TABLET | Freq: Four times a day (QID) | ORAL | Status: DC | PRN
  Administered 2015-12-09: 1 mg via ORAL
  Filled 2015-12-09: qty 2

## 2015-12-09 MED ORDER — ADULT MULTIVITAMIN W/MINERALS CH
1.0000 | ORAL_TABLET | Freq: Every day | ORAL | Status: DC
  Administered 2015-12-10: 1 via ORAL
  Filled 2015-12-09: qty 1

## 2015-12-09 MED ORDER — LORAZEPAM 2 MG/ML IJ SOLN: 1.0000 mg | Freq: Four times a day (QID) | INTRAMUSCULAR | Status: DC | PRN

## 2015-12-09 MED ORDER — VITAMIN B-1 100 MG PO TABS
100.0000 mg | ORAL_TABLET | Freq: Every day | ORAL | Status: DC
  Administered 2015-12-10: 100 mg via ORAL
  Filled 2015-12-09: qty 1

## 2015-12-09 MED ORDER — LORAZEPAM 2 MG/ML IJ SOLN: 0.0000 mg | Freq: Four times a day (QID) | INTRAMUSCULAR | Status: DC

## 2015-12-09 NOTE — ED Notes (Signed)
Dr. Karilyn Cotaehman returned page..  Wants the nurse to page him once the Pt gets to the floor.

## 2015-12-09 NOTE — ED Notes (Signed)
Paged Dr. Ardyth HarpsHernandez re Dr. Karilyn Cotaehman coming in today to see pt but states pt will not be scoped today. Pt rating pain to head at 8/10. Order received to give Tylenol 650mg  po.

## 2015-12-09 NOTE — Progress Notes (Signed)
Patient briefly seen and examined, chart reviewed. Admitted with hematemesis. Has a history of alcoholism and prior Mallory-Weiss tear with bleeding. Hemoglobin is stable and not requiring transfusion. Agree with monitoring overnight in stepdown unit, recheck hemoglobin in the morning and continue IV Protonix for today.  Peggye PittEstela Hernandez, MD [facility] Pager: [phone]

## 2015-12-09 NOTE — Consult Note (Signed)
Referring Provider: No ref. provider found Primary Care Physician:  Meredeth IdeGagan S Lama, MD Primary Gastroenterologist:  Dr. Karilyn Cotaehman  Reason for Consultation:   Upper GI bleed.  HPI:   Patient is 30 year old Hispanic American male who presented to emergency room around midnight with history of hematemesis. Patient recalls that he ate supper around 6 PM and then he began to drink beer. He believes he may have drank 8 cans of beer by about 10 PM when he developed vomited food and fluid. Soon thereafter he could taste blood in his mouth. And then he vomited small amount of blood. Since he had experienced major bleed from Mallory-Weiss tear in December 2015 he decided to come to emergency room. Patient was evaluated and begun on IV fluids and IV PPI. His hemoglobin was 16.5 g. Drop to 15 g around 4:22 AM and about 2 hours ago was 15.3 g. Patient has not experienced any more spells of vomiting and he also denies melena or rectal bleeding. Since his last episode he has shied away from using aspirin on Goody powder and generally has taken Tylenol for musculoskeletal pain. He has heartburn no more than 2-3 times a week with certain foods. He denies epigastric pain anorexia or weight loss. He states he drinks alcohol only on weekends he does not drink on weekdays when he is working. During his last admission he tested positive for H. pylori serology. He was treated when he was seen in the office in January 2016. He has been working with [employer] for the last 4 years. He is married and has 5 children ages 172-11 and they're all in good health. He does not smoke cigarettes. He has 4 brothers and 2 sisters in good health. His mother had alcoholic cirrhosis and died of breast CA at age 31 and his father is in good health.   Past Medical History  Diagnosis Date  . Kidney stones     Past Surgical History  Procedure Laterality Date  . Tonsillectomy    . Esophagogastroduodenoscopy (egd) with propofol  N/A 10/27/2014    Procedure: ESOPHAGOGASTRODUODENOSCOPY (EGD) WITH PROPOFOL WITH THREE  CLIPS APPLIED;  Surgeon: Malissa HippoNajeeb U Rehman, MD;  Location: [facility];  Service: Endoscopy;  Laterality: N/A;  . Esophagogastroduodenoscopy (egd) with propofol N/A 10/29/2014    Procedure: ESOPHAGOGASTRODUODENOSCOPY (EGD) WITH PROPOFOL;  Surgeon: Malissa HippoNajeeb U Rehman, MD;  Location: [facility];  Service: Endoscopy;  Laterality: N/A;    Prior to Admission medications   Not on File    Current Facility-Administered Medications  Medication Dose Route Frequency Provider Last Rate Last Dose  . acetaminophen (TYLENOL) tablet 650 mg  650 mg Oral Q6H PRN Henderson CloudEstela Y Hernandez Acosta, MD      . LORazepam (ATIVAN) injection 0-4 mg  0-4 mg Intravenous Q6H Meredeth IdeGagan S Lama, MD   0 mg at 12/09/15 0306   Followed by  . [START ON 12/11/2015] LORazepam (ATIVAN) injection 0-4 mg  0-4 mg Intravenous Q12H Meredeth IdeGagan S Lama, MD      . LORazepam (ATIVAN) tablet 1 mg  1 mg Oral Q6H PRN Meredeth IdeGagan S Lama, MD       Or  . LORazepam (ATIVAN) injection 1 mg  1 mg Intravenous Q6H PRN Meredeth IdeGagan S Lama, MD      . multivitamin with minerals tablet 1 tablet  1 tablet Oral Daily Meredeth IdeGagan S Lama, MD   1 tablet at 12/09/15 0934  . ondansetron (ZOFRAN) injection 4 mg  4 mg Intravenous 4 times per day  Meredeth Ide, MD   4 mg at 12/09/15 [phone]  . pantoprazole (PROTONIX) 80 mg in sodium chloride 0.9 % 250 mL (0.32 mg/mL) infusion  8 mg/hr Intravenous Continuous Devoria Albe, MD 25 mL/hr at 12/09/15 0039 8 mg/hr at 12/09/15 0039  . [START ON 12/12/2015] pantoprazole (PROTONIX) injection 40 mg  40 mg Intravenous Q12H Devoria Albe, MD      . thiamine (VITAMIN B-1) tablet 100 mg  100 mg Oral Daily Meredeth Ide, MD       Or  . thiamine (B-1) injection 100 mg  100 mg Intravenous Daily Meredeth Ide, MD   100 mg at 12/09/15 0932   No current outpatient prescriptions on file.    Allergies as of 12/08/2015  . (No Known Allergies)    History reviewed. No pertinent family history.  Social  History   Social History  . Marital Status: Single    Spouse Name: N/A  . Number of Children: N/A  . Years of Education: N/A   Occupational History  . Not on file.   Social History Main Topics  . Smoking status: Never Smoker   . Smokeless tobacco: Not on file  . Alcohol Use: 0.0 oz/week    0 Standard drinks or equivalent per week  . Drug Use: Yes    Special: Cocaine     Comment: marijuana  . Sexual Activity: Not on file   Other Topics Concern  . Not on file   Social History Narrative    Review of Systems: See HPI, otherwise normal ROS  Physical Exam: Temp:  [98.1 F (36.7 C)-98.3 F (36.8 C)] 98.1 F (36.7 C) (01/16 0114) Pulse Rate:  [86-120] 100 (01/16 1200) Resp:  [16-20] 16 (01/16 1100) BP: (105-150)/(58-98) 124/81 mmHg (01/16 1200) SpO2:  [94 %-100 %] 100 % (01/16 1200) Weight:  [160 lb (72.576 kg)] 160 lb (72.576 kg) (01/15 2334)   Patient was seen in emergency room.  Patient is alert and in no acute distress. He is not tremulous. Conjunctiva is pink. Sclera is icteric. Oropharyngeal mucosa is normal. No neck masses or thyromegaly noted. Cardiac exam with regular rhythm normal S1 and S2. No murmur or gallop noted. Lungs are clear to auscultation. Abdomen is symmetrical. Bowel sounds are normal. On palpation abdomen is soft and nontender without organomegaly or masses. No peripheral edema or clubbing noted. He has multiple tattoos including large one over his right arm.    Lab Results:  Recent Labs  12/09/15 12/09/15 0301 12/09/15 0422 12/09/15 1035  WBC 9.3  --  10.0  --   HGB 16.5 14.8 15.0 15.3  HCT 46.6 42.6 44.0 44.2  PLT 297  --  274  --    BMET  Recent Labs  12/09/15 12/09/15 0422  NA 142 142  K 3.5 4.1  CL 104 108  CO2 24 23  GLUCOSE 102* 99  BUN 9 8  CREATININE 0.82 0.85  CALCIUM 9.5 8.7*   LFT  Recent Labs  12/09/15 0422  PROT 6.9  ALBUMIN 4.1  AST 19  ALT 15*  ALKPHOS 103  BILITOT 0.7    Assessment; #1.  Upper GI bleed. UGIB is most likely secondary to Mallory-Weiss tear which she has history of. He has been in emergency room for 12 hours and has not experienced any more episodes of hematemesis or melena and his H&H remained stable. Therefore there is no indication for EGD unless there is evidence of exit bleed or significant drop in his  H&H. #2. History of H. pylori gastritis treated 1 year ago. #3. Alcohol abuse. Patient's alcohol level on admission was 275 mg/dL. He is on CIWA protocol to prevent alcohol withdrawal. Patient encouraged to seek help feet cannot control alcohol abuse on his own. Transaminases are normal and therefore he does not have alcoholic hepatitis. #4. Patient has multiple tattoos and will be screened for HCV. It appears he was vaccinated for hepatitis B when he was in middle school.  Recommendations; Begin clear liquids. Monitor H&H frequently for the next 24 hours. Continue IV PPI for now. HCV antibody. Will proceed with esophagogastroduodenoscopy if there is evidence of recurrent GI bleed.     REHMAN,NAJEEB U  12/09/2015, 12:23 PM

## 2015-12-09 NOTE — ED Notes (Signed)
Per Dr. Karilyn Cotaehman, pt will be scoped only if hgb drops 2 gms., or if another episode of hematemesis. Dr. Karilyn Cotaehman wants to be notified if pt has any more episodes of hematemesis as well. Clear liquid diet ordered for pt.

## 2015-12-09 NOTE — H&P (Signed)
PCP:   No PCP Per Patient   Chief Complaint:  Vomiting blood  HPI: 30 year old male with medical history of tonsillectomy as a child, upper GI bleed with Mallory-Weiss tear status post application of 3 clips for the tear in December 2015, who came to the hospital with vomiting blood. Patient says that he was drinking beer tonight and started having nausea and vomited twice, as he tasted blood in his mouth he went in the bathroom and vomited again and noticed frank blood in the vomitus. He admits to having nausea and heartburn. Denies shortness of breath. Felt dizzy but did not pass out. Denies any fever, no dysuria urgency frequency of urination.  In the ED hemoglobin was found to be 16.5.  Allergies:  No Known Allergies    Past Medical History  Diagnosis Date  . Kidney stones     Past Surgical History  Procedure Laterality Date  . Tonsillectomy    . Esophagogastroduodenoscopy (egd) with propofol N/A 10/27/2014    Procedure: ESOPHAGOGASTRODUODENOSCOPY (EGD) WITH PROPOFOL WITH THREE  CLIPS APPLIED;  Surgeon: Malissa HippoNajeeb U Rehman, MD;  Location: [facility];  Service: Endoscopy;  Laterality: N/A;  . Esophagogastroduodenoscopy (egd) with propofol N/A 10/29/2014    Procedure: ESOPHAGOGASTRODUODENOSCOPY (EGD) WITH PROPOFOL;  Surgeon: Malissa HippoNajeeb U Rehman, MD;  Location: [facility];  Service: Endoscopy;  Laterality: N/A;    Prior to Admission medications   Not on File    Social History:  reports that he has never smoked. He does not have any smokeless tobacco history on file. He reports that he drinks alcohol. He reports that he uses illicit drugs (Cocaine).  Patient's mother had  breast cancer  Filed Weights   12/08/15 2334  Weight: 72.576 kg (160 lb)    All the positives are listed in BOLD  Review of Systems:  HEENT: Headache, blurred vision, runny nose, sore throat Neck: Hypothyroidism, hyperthyroidism,,lymphadenopathy Chest : Shortness of breath, history of COPD, Asthma Heart : Chest  pain, history of coronary arterey disease GI:  Nausea, vomiting, diarrhea, constipation, GERD GU: Dysuria, urgency, frequency of urination, hematuria Neuro: Stroke, seizures, syncope Psych: Depression, anxiety, hallucinations   Physical Exam: Blood pressure 132/85, pulse 102, temperature 98.1 F (36.7 C), temperature source Oral, resp. rate 20, height 5\' 9"  (1.753 m), weight 72.576 kg (160 lb), SpO2 100 %. Constitutional:   Patient is a well-developed and well-nourished male in no acute distress and cooperative with exam. Head: Normocephalic and atraumatic Mouth: Mucus membranes moist Eyes: PERRL, EOMI, conjunctivae normal Neck: Supple, No Thyromegaly Cardiovascular: RRR, S1 normal, S2 normal Pulmonary/Chest: CTAB, no wheezes, rales, or rhonchi Abdominal: Soft. Non-tender, non-distended, bowel sounds are normal, no masses, organomegaly, or guarding present.  Neurological: A&O x3, Strength is normal and symmetric bilaterally, cranial nerve II-XII are grossly intact, no focal motor deficit, sensory intact to light touch bilaterally.  Extremities : No Cyanosis, Clubbing or Edema  Labs on Admission:  Basic Metabolic Panel:  Recent Labs Lab 12/09/15  NA 142  K 3.5  CL 104  CO2 24  GLUCOSE 102*  BUN 9  CREATININE 0.82  CALCIUM 9.5   Liver Function Tests:  Recent Labs Lab 12/09/15  AST 20  ALT 16*  ALKPHOS 115  BILITOT 0.7  PROT 7.9  ALBUMIN 4.9    Recent Labs Lab 12/09/15  LIPASE 24   No results for input(s): AMMONIA in the last 168 hours. CBC:  Recent Labs Lab 12/09/15  WBC 9.3  NEUTROABS 6.9  HGB 16.5  HCT 46.6  MCV 87.6  PLT 297        Assessment/Plan Active Problems:   UGI bleed   Hematemesis  Hematemesis Has a history of Mallory-Weiss tear Patient presenting with hematemesis, will start IV Protonix infusion. Monitor H&H every 8 hours GI consultation in a.m.  Alcohol abuse Patient's alcohol level is 275 Start CIWA protocol  DVT  prophylaxis SCDs  Code status: Full code  Family discussion: No family present at bedside   Time Spent on Admission: 60 min  Bryce Benson S [facility] Pager: [phone] 12/09/2015, 2:01 AM  If 7PM-7AM, please contact night-coverage  [email or web address]  Password [email or web address]

## 2015-12-09 NOTE — ED Notes (Signed)
No active vomiting noted.

## 2015-12-09 NOTE — ED Notes (Signed)
Pharmacy to send Protonix.

## 2015-12-10 LAB — HEPATITIS C ANTIBODY: HCV Ab: <0.1 s/co ratio (ref 0.0–0.9)

## 2015-12-10 LAB — HEMOGLOBIN AND HEMATOCRIT, BLOOD
HCT: 43.3 % (ref 39.0–52.0)
Hemoglobin: 14.8 g/dL (ref 13.0–17.0)

## 2015-12-10 MED ORDER — THIAMINE HCL 100 MG PO TABS: 100.0000 mg | ORAL_TABLET | Freq: Every day | ORAL | Status: DC

## 2015-12-10 MED ORDER — PANTOPRAZOLE SODIUM 40 MG PO TBEC: 40.0000 mg | DELAYED_RELEASE_TABLET | Freq: Every day | ORAL | Status: DC

## 2015-12-10 MED ORDER — ADULT MULTIVITAMIN W/MINERALS CH: 1.0000 | ORAL_TABLET | Freq: Every day | ORAL | Status: DC

## 2015-12-10 NOTE — Discharge Summary (Signed)
Physician Discharge Summary  Bryce Benson:[id number] DOB: 1986/05/17 DOA: 12/08/2015  PCP: No PCP Per Patient  Admit date: 12/08/2015 Discharge date: 12/10/2015  Time spent: 45 minutes  Recommendations for Outpatient Follow-up:  -Will be discharged home today. -Advised to follow up with PCP in 2 weeks.   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 Patient says that he was drinking beer tonight and started having nausea and vomited twice, as he tasted blood in his mouth he went in the bathroom and vomited again and noticed frank blood in the vomitus. He admits to having nausea and heartburn. Denies shortness of breath. Felt dizzy but did not pass out. Denies any fever, no dysuria urgency frequency of urination.  In the ED hemoglobin was found to be 16.5.  Hospital Course:   Hematemesis -Likely due to Mallory-Weiss tear. -No further bleeding. -Hb has remained stable without necessity for transfusion.  ETOH Abuse -Counseled on cessation. -Thiamine/folate/MVI.  Procedures:  None   Consultations:  GI, Dr. Karilyn Cota  Discharge Instructions  Discharge Instructions    Increase activity slowly    Complete by:  As directed             Medication List    TAKE these medications        multivitamin with minerals Tabs tablet  Take 1 tablet by mouth daily.     pantoprazole 40 MG tablet  Commonly known as:  PROTONIX  Take 1 tablet (40 mg total) by mouth daily.     thiamine 100 MG tablet  Take 1 tablet (100 mg total) by mouth daily.       No Known  Allergies     Follow-up Information    Schedule an appointment as soon as possible for a visit in 2 weeks to follow up.   Why:  with your primary care provider       The results of significant diagnostics from this hospitalization (including imaging, microbiology, ancillary and laboratory) are listed below for reference.    Significant Diagnostic Studies: No results found.  Microbiology: Recent Results (from the past 240 hour(s))  MRSA PCR Screening     Status: None   Collection Time: 12/09/15  3:30 PM  Result Value Ref Range Status   MRSA by PCR NEGATIVE NEGATIVE Final    Comment:        The GeneXpert MRSA Assay (FDA approved for NASAL specimens only), is one component of a comprehensive MRSA colonization surveillance program. It is not intended to diagnose MRSA infection nor to guide or monitor treatment for MRSA infections.      Labs: Basic Metabolic Panel:  Recent Labs Lab 12/09/15 12/09/15 0422  NA 142 142  K 3.5 4.1  CL 104 108  CO2 24 23  GLUCOSE 102* 99  BUN 9 8  CREATININE 0.82 0.85  CALCIUM 9.5 8.7*   Liver Function Tests:  Recent Labs Lab 12/09/15 12/09/15 0422  AST 20 19  ALT 16* 15*  ALKPHOS 115 103  BILITOT 0.7 0.7  PROT 7.9 6.9  ALBUMIN 4.9  4.1    Recent Labs Lab 12/09/15  LIPASE 24   No results for input(s): AMMONIA in the last 168 hours. CBC:  Recent Labs Lab 12/09/15 12/09/15 0301 12/09/15 0422 12/09/15 1035 12/09/15 1838 12/10/15 0228  WBC 9.3  --  10.0  --   --   --   NEUTROABS 6.9  --   --   --   --   --   HGB 16.5 14.8 15.0 15.3 14.5 14.8  HCT 46.6 42.6 44.0 44.2 42.6 43.3  MCV 87.6  --  89.8  --   --   --   PLT 297  --  274  --   --   --    Cardiac Enzymes: No results for input(s): CKTOTAL, CKMB, CKMBINDEX, TROPONINI in the last 168 hours. BNP: BNP (last 3 results) No results for input(s): BNP in the last 8760 hours.  ProBNP (last 3 results) No results for input(s): PROBNP in the last 8760  hours.  CBG: No results for input(s): GLUCAP in the last 168 hours.     SignedChaya Jan  [facility] Pager: [phone] 12/10/2015, 10:00 AM

## 2016-05-27 ENCOUNTER — Telehealth (INDEPENDENT_AMBULATORY_CARE_PROVIDER_SITE_OTHER): Payer: Self-pay | Admitting: Internal Medicine

## 2016-05-27 NOTE — Telephone Encounter (Signed)
Patient called, stated that we tested him for Hep C and he never got the results.  He would like a call back.  [phone]

## 2016-05-27 NOTE — Telephone Encounter (Signed)
This will need to be addressed with Dr.Rehman. HCV Ab 0.0 - 0.9 s/co ratio <0.1   Comments: (NOTE)                  Negative:   < 0.8                Indeterminate: 0.8 - 0.9                  Positive:   > 0.9  The CDC recommends that a positive HCV antibody result  be followed up with a HCV Nucleic Acid Amplification  test ([id number]).  Performed At: [facility]  [address] [place], [place] [id number]  Mila HomerHancock William F MD BJ:[phone]:[phone]

## 2016-05-27 NOTE — Telephone Encounter (Signed)
Patient was called and a message ws left with result. Also , ask that the patient call office to confirm that he rec'd message and that we wanted to make sure that he had no further questions.

## 2016-05-27 NOTE — Telephone Encounter (Signed)
This is for Dr. Rehman 

## 2016-07-17 ENCOUNTER — Emergency Department (HOSPITAL_COMMUNITY)
Admission: EM | Admit: 2016-07-17 | Discharge: 2016-07-17 | Disposition: A | Discharge status: Home / Self Care | Payer: No Typology Code available for payment source | Attending: Emergency Medicine | Admitting: Emergency Medicine

## 2016-07-17 ENCOUNTER — Encounter (HOSPITAL_COMMUNITY): Payer: Self-pay | Admitting: Emergency Medicine

## 2016-07-17 DIAGNOSIS — R1084 Generalized abdominal pain: Secondary | ICD-10-CM | POA: Diagnosis not present

## 2016-07-17 DIAGNOSIS — Y9389 Activity, other specified: Secondary | ICD-10-CM | POA: Insufficient documentation

## 2016-07-17 DIAGNOSIS — Y92481 Parking lot as the place of occurrence of the external cause: Secondary | ICD-10-CM | POA: Insufficient documentation

## 2016-07-17 DIAGNOSIS — Y999 Unspecified external cause status: Secondary | ICD-10-CM | POA: Insufficient documentation

## 2016-07-17 DIAGNOSIS — M545 Low back pain, unspecified: Secondary | ICD-10-CM

## 2016-07-17 DIAGNOSIS — F17200 Nicotine dependence, unspecified, uncomplicated: Secondary | ICD-10-CM | POA: Insufficient documentation
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
## 2019-02-09 ENCOUNTER — Other Ambulatory Visit: Payer: Self-pay

## 2019-02-09 ENCOUNTER — Emergency Department (HOSPITAL_COMMUNITY): Payer: Self-pay

## 2019-02-09 ENCOUNTER — Encounter (HOSPITAL_COMMUNITY): Payer: Self-pay | Admitting: Emergency Medicine

## 2019-02-09 ENCOUNTER — Emergency Department (HOSPITAL_COMMUNITY)
Admission: EM | Admit: 2019-02-09 | Discharge: 2019-02-09 | Disposition: A | Discharge status: Home / Self Care | Payer: Self-pay | Attending: Emergency Medicine | Admitting: Emergency Medicine

## 2019-02-09 DIAGNOSIS — Y929 Unspecified place or not applicable: Secondary | ICD-10-CM | POA: Insufficient documentation

## 2019-02-09 DIAGNOSIS — F17200 Nicotine dependence, unspecified, uncomplicated: Secondary | ICD-10-CM | POA: Insufficient documentation

## 2019-02-09 DIAGNOSIS — Y999 Unspecified external cause status: Secondary | ICD-10-CM | POA: Insufficient documentation

## 2019-02-09 DIAGNOSIS — S43004A Unspecified dislocation of right shoulder joint, initial encounter: Secondary | ICD-10-CM | POA: Insufficient documentation

## 2019-02-09 DIAGNOSIS — Y9389 Activity, other specified: Secondary | ICD-10-CM | POA: Insufficient documentation

## 2019-02-09 MED ORDER — HYDROCODONE-ACETAMINOPHEN 5-325 MG PO TABS
2.0000 | ORAL_TABLET | Freq: Once | ORAL | Status: AC
  Administered 2019-02-09: 2 via ORAL
  Filled 2019-02-09: qty 2

## 2019-02-09 MED ORDER — IBUPROFEN 800 MG PO TABS: 800.0000 mg | ORAL_TABLET | Freq: Three times a day (TID) | ORAL | 0 refills | Status: DC | PRN

## 2019-02-09 MED ORDER — IBUPROFEN 800 MG PO TABS
800.0000 mg | ORAL_TABLET | Freq: Once | ORAL | Status: AC
  Administered 2019-02-09: 800 mg via ORAL
  Filled 2019-02-09: qty 1

## 2019-02-09 NOTE — ED Notes (Signed)
Patient stated he is not having any pain in his right shoulder at this time, only when he tries to move it.
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
## 2019-02-09 NOTE — Discharge Instructions (Addendum)
You were seen in the emergency department for a right shoulder injury.  Your x-ray did not show any fractures or dislocations but did show a separation of your collarbone and your shoulder.  You should stay in the sling for support and use ice to the affected area.  Ibuprofen 3 times a day with food.  Follow-up with orthopedics.  return if any concerns.

## 2019-02-13 ENCOUNTER — Ambulatory Visit: Payer: Self-pay | Admitting: Orthopedic Surgery

## 2019-02-14 ENCOUNTER — Encounter: Payer: Self-pay | Admitting: Orthopedic Surgery

## 2019-02-28 ENCOUNTER — Telehealth: Payer: Self-pay | Admitting: Orthopedic Surgery
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
## 2019-03-01 NOTE — Telephone Encounter (Signed)
Set up a virtual visit: I think Friday is good Friday so it cant be then   Virtual visit needs 20 minutes

## 2019-03-02 ENCOUNTER — Encounter: Payer: Self-pay | Admitting: Orthopedic Surgery

## 2019-03-13 ENCOUNTER — Telehealth: Payer: Self-pay | Admitting: Orthopedic Surgery

## 2019-03-13 NOTE — Telephone Encounter (Signed)
I have reached out to contact this patient and the number is not a working number. Dr. Romeo Apple has agreed to see him but I can't get in touch with patient. I have tried his emergency contacts and left messages for patient to contact our office.  I will keep trying.

## 2020-08-08 ENCOUNTER — Other Ambulatory Visit: Payer: Self-pay

## 2020-08-26 ENCOUNTER — Emergency Department (HOSPITAL_COMMUNITY)
Admission: EM | Admit: 2020-08-26 | Discharge: 2020-08-26 | Disposition: A | Discharge status: Home / Self Care | Payer: No Typology Code available for payment source

## 2020-08-26 ENCOUNTER — Other Ambulatory Visit: Payer: Self-pay

## 2020-08-30 ENCOUNTER — Encounter (HOSPITAL_COMMUNITY): Payer: Self-pay

## 2020-08-30 ENCOUNTER — Emergency Department (HOSPITAL_COMMUNITY): Payer: No Typology Code available for payment source

## 2020-08-30 ENCOUNTER — Other Ambulatory Visit: Payer: Self-pay

## 2020-08-30 ENCOUNTER — Emergency Department (HOSPITAL_COMMUNITY)
Admission: EM | Admit: 2020-08-30 | Discharge: 2020-08-30 | Disposition: A | Discharge status: Home / Self Care | Payer: No Typology Code available for payment source | Attending: Emergency Medicine | Admitting: Emergency Medicine

## 2020-08-30 DIAGNOSIS — R109 Unspecified abdominal pain: Secondary | ICD-10-CM | POA: Insufficient documentation

## 2020-08-30 DIAGNOSIS — R519 Headache, unspecified: Secondary | ICD-10-CM | POA: Diagnosis not present

## 2020-08-30 DIAGNOSIS — F17200 Nicotine dependence, unspecified, uncomplicated: Secondary | ICD-10-CM | POA: Insufficient documentation

## 2020-08-30 DIAGNOSIS — R079 Chest pain, unspecified: Secondary | ICD-10-CM | POA: Diagnosis not present

## 2020-08-30 DIAGNOSIS — M542 Cervicalgia: Secondary | ICD-10-CM | POA: Diagnosis not present

## 2020-08-30 LAB — CBC WITH DIFFERENTIAL/PLATELET
Abs Immature Granulocytes: 0.04 10*3/uL (ref 0.00–0.07)
Basophils Absolute: 0 10*3/uL (ref 0.0–0.1)
Basophils Relative: 0 %
Eosinophils Absolute: 0.1 10*3/uL (ref 0.0–0.5)
Eosinophils Relative: 1 %
HCT: 44.5 % (ref 39.0–52.0)
Hemoglobin: 15.6 g/dL (ref 13.0–17.0)
Immature Granulocytes: 1 %
Lymphocytes Relative: 13 %
Lymphs Abs: 0.9 10*3/uL (ref 0.7–4.0)
MCH: 32.8 pg (ref 26.0–34.0)
MCHC: 35.1 g/dL (ref 30.0–36.0)
MCV: 93.5 fL (ref 80.0–100.0)
Monocytes Absolute: 0.7 10*3/uL (ref 0.1–1.0)
Monocytes Relative: 10 %
Neutro Abs: 5 10*3/uL (ref 1.7–7.7)
Neutrophils Relative %: 75 %
Platelets: 254 10*3/uL (ref 150–400)
RBC: 4.76 MIL/uL (ref 4.22–5.81)
RDW: 11.8 % (ref 11.5–15.5)
WBC: 6.7 10*3/uL (ref 4.0–10.5)
nRBC: 0 % (ref 0.0–0.2)

## 2020-08-30 LAB — COMPREHENSIVE METABOLIC PANEL
ALT: 26 U/L (ref 0–44)
AST: 21 U/L (ref 15–41)
Albumin: 4.2 g/dL (ref 3.5–5.0)
Alkaline Phosphatase: 85 U/L (ref 38–126)
Anion gap: 10 (ref 5–15)
BUN: 10 mg/dL (ref 6–20)
CO2: 26 mmol/L (ref 22–32)
Calcium: 9.6 mg/dL (ref 8.9–10.3)
Chloride: 99 mmol/L (ref 98–111)
Creatinine, Ser: 0.75 mg/dL (ref 0.61–1.24)
GFR calc non Af Amer: >60 mL/min (ref >60)
Glucose, Bld: 98 mg/dL (ref 70–99)
Potassium: 4.1 mmol/L (ref 3.5–5.1)
Sodium: 135 mmol/L (ref 135–145)
Total Bilirubin: 1.1 mg/dL (ref 0.3–1.2)
Total Protein: 7.3 g/dL (ref 6.5–8.1)

## 2020-08-30 MED ORDER — IOHEXOL 300 MG/ML  SOLN
100.0000 mL | Freq: Once | INTRAMUSCULAR | Status: AC | PRN
  Administered 2020-08-30: 100 mL via INTRAVENOUS

## 2020-08-30 MED ORDER — HYDROCODONE-ACETAMINOPHEN 5-325 MG PO TABS
1.0000 | ORAL_TABLET | Freq: Once | ORAL | Status: AC
  Administered 2020-08-30: 1 via ORAL
  Filled 2020-08-30: qty 1

## 2020-08-30 MED ORDER — HYDROCODONE-ACETAMINOPHEN 5-325 MG PO TABS: ORAL_TABLET | ORAL | 0 refills | Status: DC

## 2020-08-30 NOTE — Discharge Instructions (Signed)
Follow-up with Dr. Johnsie Cancel in 2 weeks if not improving. Take Tylenol for pain and if that does not help he can take the hydrocodone prescription medicine
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
## 2022-09-04 ENCOUNTER — Emergency Department (HOSPITAL_COMMUNITY)
Admission: EM | Admit: 2022-09-04 | Discharge: 2022-09-04 | Disposition: A | Discharge status: Home / Self Care | Payer: Self-pay | Attending: Emergency Medicine | Admitting: Emergency Medicine

## 2022-09-04 ENCOUNTER — Encounter (HOSPITAL_COMMUNITY): Payer: Self-pay

## 2022-09-04 ENCOUNTER — Other Ambulatory Visit: Payer: Self-pay

## 2022-09-04 DIAGNOSIS — A568 Sexually transmitted chlamydial infection of other sites: Secondary | ICD-10-CM | POA: Insufficient documentation

## 2022-09-04 DIAGNOSIS — A64 Unspecified sexually transmitted disease: Secondary | ICD-10-CM

## 2022-09-04 LAB — URINALYSIS, ROUTINE W REFLEX MICROSCOPIC
Bilirubin Urine: NEGATIVE
Glucose, UA: NEGATIVE mg/dL
Ketones, ur: NEGATIVE mg/dL
Nitrite: NEGATIVE
Protein, ur: NEGATIVE mg/dL
Specific Gravity, Urine: 1.006 (ref 1.005–1.030)
WBC, UA: >50 WBC/hpf — ABNORMAL HIGH (ref 0–5)
pH: 6 (ref 5.0–8.0)

## 2022-09-04 MED ORDER — DOXYCYCLINE HYCLATE 100 MG PO CAPS: 100.0000 mg | ORAL_CAPSULE | Freq: Two times a day (BID) | ORAL | 0 refills | Status: AC

## 2022-09-04 MED ORDER — LIDOCAINE HCL (PF) 1 % IJ SOLN
INTRAMUSCULAR | Status: AC
  Administered 2022-09-04: 1 mL
  Filled 2022-09-04: qty 30

## 2022-09-04 MED ORDER — CEFTRIAXONE SODIUM 500 MG IJ SOLR
500.0000 mg | Freq: Once | INTRAMUSCULAR | Status: AC
  Administered 2022-09-04: 500 mg via INTRAMUSCULAR
  Filled 2022-09-04: qty 500

## 2022-09-04 NOTE — ED Triage Notes (Signed)
Pt reports pain in his penis x3days. Hx of kidney stone. Pt reports painful urination. Pain has been getting worse. Pt reports his penis looks different.

## 2022-09-04 NOTE — ED Provider Notes (Signed)
[facility] EMERGENCY DEPARTMENT Provider Note   CSN: [id number] Arrival date & time: 09/04/22  2032     History  Chief Complaint  Patient presents with   Penis Pain    Bryce Benson is a 36 y.o. male.   Penis Pain  36 y/o male - with hx of new sexual partner 2 weeks ago - unprotected experience - 3 days ago started having some burning with  urination and then some pus from penis - now with some swelling and ongoing pus - he can maintain a stream without difficulty - he has no fever, no abd pain an dno scrotum or testicular pain or swelling.  No hx of sTD's.     Home Medications Prior to Admission medications   Medication Sig Start Date End Date Taking? Authorizing Provider  doxycycline (VIBRAMYCIN) 100 MG capsule Take 1 capsule (100 mg total) by mouth 2 (two) times daily for 7 days. 09/04/22 09/11/22 Yes Noemi Chapel, MD      Allergies    Patient has no known allergies.    Review of Systems   Review of Systems  Constitutional:  Negative for fever.  Genitourinary:  Positive for penile discharge, penile pain and penile swelling. Negative for difficulty urinating and testicular pain.    Physical Exam Updated Vital Signs BP (!) 154/105   Pulse 97   Temp 98.1 F (36.7 C) (Temporal)   Resp 20   Ht 1.778 m (5\' 10" )   Wt 77 kg   SpO2 100%   BMI 24.35 kg/m  Physical Exam Vitals and nursing note reviewed.  Constitutional:      Appearance: He is well-developed. He is not diaphoretic.  HENT:     Head: Normocephalic and atraumatic.  Eyes:     General:        Right eye: No discharge.        Left eye: No discharge.     Conjunctiva/sclera: Conjunctivae normal.  Pulmonary:     Effort: Pulmonary effort is normal. No respiratory distress.  Abdominal:     Comments: Soft nontender abdomen  Genitourinary:    Testes: Normal.     Comments: The patient has some purulent discharge at the urethral meatus.  There is mild swelling of the penis, normal-appearing scrotum and  testicles Skin:    General: Skin is warm and dry.     Findings: No erythema or rash.  Neurological:     Mental Status: He is alert.     Coordination: Coordination normal.     ED Results / Procedures / Treatments   Labs (all labs ordered are listed, but only abnormal results are displayed) Labs Reviewed  URINALYSIS, ROUTINE W REFLEX MICROSCOPIC  GC/CHLAMYDIA PROBE AMP ([place]) NOT AT [facility]    EKG None  Radiology No results found.  Procedures Procedures    Medications Ordered in ED Medications  cefTRIAXone (ROCEPHIN) injection 500 mg (has no administration in time range)    ED Course/ Medical Decision Making/ A&P                           Medical Decision Making Amount and/or Complexity of Data Reviewed Labs: ordered.  Risk Prescription drug management.   No hernia Exam consistent with likely STD Patient cautioned against unprotected sex Aware that he needs to tell  Test positive Sample sent for testing, patient will be treated presumptively with Rocephin and Doxy.  Patient understands indications for ongoing treatment including being  tested for HIV as outpt, he is agreeable.        Final Clinical Impression(s) / ED Diagnoses Final diagnoses:  STI (sexually transmitted infection)    Rx / DC Orders ED Discharge Orders          Ordered    doxycycline (VIBRAMYCIN) 100 MG capsule  2 times daily        09/04/22 2222              Eber Hong, MD 09/04/22 2223

## 2022-09-04 NOTE — Discharge Instructions (Signed)
We have tested you for both gonorrhea and chlamydia but you will likely need to be tested for syphilis and HIV, you can do this at your family doctor or the health department.  Please complete the treatment for your potential infections by taking doxycycline twice a day for 7 days  Tylenol or ibuprofen as needed for pain  Please avoid unprotected sex and if you test positive you will need to tell any partners you have had in the last few months that they need to be tested and treated
